# Patient Record
Sex: Female | Born: 1995 | Race: Black or African American | Hispanic: No | Marital: Single | Smoking: Never smoker
Health system: Southern US, Community
[De-identification: ages and names within clinical notes are randomized; demographics above are authoritative.]

## PROBLEM LIST (undated history)

## (undated) DIAGNOSIS — L309 Dermatitis, unspecified: Secondary | ICD-10-CM

## (undated) HISTORY — PX: NO PAST SURGERIES: SHX2092

---
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
## 2017-01-04 ENCOUNTER — Encounter (HOSPITAL_COMMUNITY): Payer: Self-pay | Admitting: Emergency Medicine

## 2017-01-04 ENCOUNTER — Ambulatory Visit (HOSPITAL_COMMUNITY)
Admission: EM | Admit: 2017-01-04 | Discharge: 2017-01-04 | Disposition: A | Discharge status: Home / Self Care | Payer: Medicaid Other | Attending: Internal Medicine | Admitting: Internal Medicine

## 2017-01-04 DIAGNOSIS — A6000 Herpesviral infection of urogenital system, unspecified: Secondary | ICD-10-CM | POA: Insufficient documentation

## 2017-01-04 DIAGNOSIS — A6009 Herpesviral infection of other urogenital tract: Secondary | ICD-10-CM

## 2017-01-04 DIAGNOSIS — T148XXA Other injury of unspecified body region, initial encounter: Secondary | ICD-10-CM

## 2017-01-04 DIAGNOSIS — Z3202 Encounter for pregnancy test, result negative: Secondary | ICD-10-CM

## 2017-01-04 DIAGNOSIS — R102 Pelvic and perineal pain: Secondary | ICD-10-CM | POA: Insufficient documentation

## 2017-01-04 DIAGNOSIS — Z202 Contact with and (suspected) exposure to infections with a predominantly sexual mode of transmission: Secondary | ICD-10-CM

## 2017-01-04 DIAGNOSIS — E669 Obesity, unspecified: Secondary | ICD-10-CM | POA: Insufficient documentation

## 2017-01-04 DIAGNOSIS — R030 Elevated blood-pressure reading, without diagnosis of hypertension: Secondary | ICD-10-CM

## 2017-01-04 DIAGNOSIS — N898 Other specified noninflammatory disorders of vagina: Secondary | ICD-10-CM

## 2017-01-04 LAB — POCT URINALYSIS DIP (DEVICE)
Bilirubin Urine: NEGATIVE
Glucose, UA: NEGATIVE mg/dL
Ketones, ur: NEGATIVE mg/dL
Nitrite: POSITIVE — AB
Protein, ur: 30 mg/dL — AB
Specific Gravity, Urine: 1.025 (ref 1.005–1.030)
Urobilinogen, UA: 1 mg/dL (ref 0.0–1.0)
pH: 6.5 (ref 5.0–8.0)

## 2017-01-04 LAB — POCT PREGNANCY, URINE: Preg Test, Ur: NEGATIVE

## 2017-01-04 MED ORDER — AZITHROMYCIN 250 MG PO TABS
ORAL_TABLET | ORAL | Status: AC
  Filled 2017-01-04: qty 8

## 2017-01-04 MED ORDER — METRONIDAZOLE 500 MG PO TABS
ORAL_TABLET | ORAL | Status: AC
  Filled 2017-01-04: qty 4

## 2017-01-04 MED ORDER — VALACYCLOVIR HCL 1 G PO TABS: 1000.0000 mg | ORAL_TABLET | Freq: Two times a day (BID) | ORAL | 0 refills | Status: DC

## 2017-01-04 MED ORDER — GEMIFLOXACIN MESYLATE 320 MG PO TABS: 320.0000 mg | ORAL_TABLET | Freq: Once | ORAL | 0 refills | Status: DC

## 2017-01-04 MED ORDER — ONDANSETRON 4 MG PO TBDP
8.0000 mg | ORAL_TABLET | Freq: Once | ORAL | Status: AC
  Administered 2017-01-04: 8 mg via ORAL

## 2017-01-04 MED ORDER — ONDANSETRON 4 MG PO TBDP
ORAL_TABLET | ORAL | Status: AC
  Filled 2017-01-04: qty 2

## 2017-01-04 MED ORDER — METRONIDAZOLE 500 MG PO TABS
2000.0000 mg | ORAL_TABLET | Freq: Once | ORAL | Status: AC
  Administered 2017-01-04: 2000 mg via ORAL

## 2017-01-04 MED ORDER — AZITHROMYCIN 250 MG PO TABS
2000.0000 mg | ORAL_TABLET | Freq: Once | ORAL | Status: AC
  Administered 2017-01-04: 2000 mg via ORAL

## 2017-01-04 MED ORDER — GEMIFLOXACIN MESYLATE 320 MG PO TABS: 320.0000 mg | ORAL_TABLET | Freq: Once | ORAL | 0 refills | Status: AC

## 2017-01-04 NOTE — ED Triage Notes (Signed)
Pt complains of suprapubic/pelvic pain for one week.  She denies any pain with urination, but does say she has had frequency, and some mild white, cloudy discharge.  She denies any fever.

## 2017-01-04 NOTE — Discharge Instructions (Signed)
You are being treated for STD exposure. Please take valtrex for most likely herpes infection and Gemifloxacin for gonorrhea treatment. You were given treatment for trichomoniasis and chlamydia in office. Avoid sexual activity of any kind until cx are resulted, symptoms resolved. Recommend follow up with GYN of your choice/or local health department. Condoms with all future sexual activity. Your syphillis and HIV test are pending, std cx pending. BP was elevated today please follow up with PCP for recheck in 2-3 days.

## 2017-01-04 NOTE — ED Provider Notes (Signed)
CSN: [id number]     Arrival date & time 01/04/17  1107 History   First MD Initiated Contact with Patient 01/04/17 1130     Chief Complaint  Patient presents with  . Pelvic Pain   (Consider location/radiation/quality/duration/timing/severity/associated sxs/prior Treatment) The history is provided by the patient and a friend. No language interpreter was used.  Exposure to STD  This is a new problem. The current episode started more than 1 week ago. The problem occurs constantly. The problem has not changed since onset.Associated symptoms comments: Vaginal pain with walking, recently shaved area, pt reports new partner and recent UTI. The symptoms are aggravated by walking. Nothing relieves the symptoms. She has tried nothing for the symptoms.    Past Medical History:  Diagnosis Date  . Eczema    History reviewed. No pertinent surgical history. History reviewed. No pertinent family history. Social History  Substance Use Topics  . Smoking status: Never Smoker  . Smokeless tobacco: Never Used  . Alcohol use Yes     Comment: social   OB History    No data available     Review of Systems  Genitourinary: Positive for dysuria, genital sores, vaginal discharge and vaginal pain.  Musculoskeletal: Negative for back pain.  Skin: Positive for wound.  All other systems reviewed and are negative.   Allergies  Ceclor [cefaclor]  Home Medications   Prior to Admission medications   Medication Sig Start Date End Date Taking? Authorizing Provider  gemifloxacin (FACTIVE) 320 MG tablet Take 1 tablet (320 mg total) by mouth once. 01/04/17 01/04/17  Clancy Gourd, NP  hydrocortisone cream 1 % Apply 1 application topically 2 (two) times daily as needed (Insect bite).    Historical Provider, MD  neomycin-bacitracin-polymyxin (NEOSPORIN) ointment Apply 1 application topically every 12 (twelve) hours. apply to eye    Historical Provider, MD  sulfamethoxazole-trimethoprim (SEPTRA DS) 800-160 MG  per tablet Take 1 tablet by mouth every 12 (twelve) hours. 03/18/13   Garlon Hatchet, PA-C  valACYclovir (VALTREX) 1000 MG tablet Take 1 tablet (1,000 mg total) by mouth 2 (two) times daily. 01/04/17   Clancy Gourd, NP   Meds Ordered and Administered this Visit   Medications  azithromycin (ZITHROMAX) tablet 2,000 mg (2,000 mg Oral Given 01/04/17 1244)  ondansetron (ZOFRAN-ODT) disintegrating tablet 8 mg (8 mg Oral Given 01/04/17 1244)  metroNIDAZOLE (FLAGYL) tablet 2,000 mg (2,000 mg Oral Given 01/04/17 1244)    BP 137/85 (BP Location: Left Wrist)   Pulse (!) 105   Temp 99.7 F (37.6 C) (Oral)   SpO2 96%  No data found.   Physical Exam  Constitutional: She is oriented to person, place, and time. She appears well-developed and well-nourished. She is active and cooperative. No distress.  Obese AA female, pt has 99.7 temp and elevated blood pressure/HR  HENT:  Head: Normocephalic.  Eyes: Pupils are equal, round, and reactive to light.  Neck: Normal range of motion.  Cardiovascular: Normal rate and regular rhythm.   Pulmonary/Chest: Effort normal and breath sounds normal.  Abdominal: Normal appearance and bowel sounds are normal. There is no tenderness.  Genitourinary: Uterus normal.    Pelvic exam was performed with patient supine. There is tenderness and lesion on the right labia. There is tenderness and lesion on the left labia. Cervix exhibits discharge. Cervix exhibits no motion tenderness and no friability. Right adnexum displays no mass, no tenderness and no fullness. Left adnexum displays no mass, no tenderness and no fullness. There is erythema in the  vagina. Vaginal discharge found.  Genitourinary Comments: Noted ulceration with erythematous base of vaginal vault, +moderate frothy green/yellow discharge; exam and cx performed with chaperone present.  Musculoskeletal: Normal range of motion.  Neurological: She is alert and oriented to person, place, and time. GCS eye subscore  is 4. GCS verbal subscore is 5. GCS motor subscore is 6.  Skin: Skin is warm and dry. Abrasion and lesion noted.     Psychiatric: She has a normal mood and affect. Her speech is normal and behavior is normal.  Nursing note and vitals reviewed.   Urgent Care Course     Procedures (including critical care time)  Labs Review Labs Reviewed  POCT URINALYSIS DIP (DEVICE) - Abnormal; Notable for the following:       Result Value   Hgb urine dipstick MODERATE (*)    Protein, ur 30 (*)    Nitrite POSITIVE (*)    Leukocytes, UA SMALL (*)    All other components within normal limits  HSV CULTURE AND TYPING  URINE CULTURE  RPR  HIV ANTIBODY (ROUTINE TESTING)  POCT PREGNANCY, URINE  CERVICOVAGINAL ANCILLARY ONLY    Imaging Review No results found.       MDM   1. Vaginal discharge   2. STD exposure   3. Herpes genitalis in women   4. Abrasion   5. Elevated blood pressure reading     1200: pelvic exam set up and performed  with cx obtained.   1230: Discussed findings with pt and most likely this is STD exposure(GC and HSV), will test for HIV and RPR in UC. Discussed refraining from sexual activity until cx and treatment completed, symptoms resolved. Condoms with all future sexual activity. Will treat with Azithromycin 2gm, Zofran  po, Flagyl 2 gm po, script for gemifloxacin 320 mg po given d/t cephalosporin allergy reported, Valtrex script for most likely HSV.  Recheck Bp as it was elevated today. Pt verbalized understanding to this provider. Aware Syphillis and HIV test were collected and pending. UC pending as well. Preg test negative.   Clancy Gourd, NP 01/04/17 1322

## 2017-01-05 LAB — CERVICOVAGINAL ANCILLARY ONLY
Bacterial vaginitis: POSITIVE — AB
Candida vaginitis: POSITIVE — AB
Chlamydia: NEGATIVE
Neisseria Gonorrhea: NEGATIVE
Trichomonas: NEGATIVE

## 2017-01-05 LAB — HIV ANTIBODY (ROUTINE TESTING W REFLEX): HIV Screen 4th Generation wRfx: NONREACTIVE

## 2017-01-05 LAB — RPR: RPR Ser Ql: NONREACTIVE

## 2017-01-06 ENCOUNTER — Telehealth: Payer: Self-pay | Admitting: Internal Medicine

## 2017-01-06 LAB — URINE CULTURE: Culture: >=100000 — AB

## 2017-01-06 LAB — HSV CULTURE AND TYPING

## 2017-01-06 MED ORDER — METRONIDAZOLE 500 MG PO TABS: 500.0000 mg | ORAL_TABLET | Freq: Two times a day (BID) | ORAL | 0 refills | Status: DC

## 2017-01-06 MED ORDER — FLUCONAZOLE 150 MG PO TABS: 150.0000 mg | ORAL_TABLET | Freq: Once | ORAL | 0 refills | Status: AC

## 2017-01-06 NOTE — Telephone Encounter (Signed)
Clinical staff, please see if patient was able to fill rx for gemifloxacin given at [facility] visit for UTI symptoms recently.   If filled and taken, no further UTI treatment is needed.  If not filled yet, and no alternate treatment for UTI offered/taken, would send rx for cephalexin  bid x 7d #14 no refills.  Also, tests for candida (yeast) and gardnerella (bacterial vaginosis) were positive. Rx fluconazole and metronidazole were sent to the pharmacy of record, [facility] at [facility] [facility] [employer].   Recheck or followup with PCP for further evaluation if symptoms are not improving.  LM

## 2019-08-20 ENCOUNTER — Telehealth (INDEPENDENT_AMBULATORY_CARE_PROVIDER_SITE_OTHER): Payer: Self-pay | Admitting: General Practice

## 2019-08-20 DIAGNOSIS — O3680X0 Pregnancy with inconclusive fetal viability, not applicable or unspecified: Secondary | ICD-10-CM

## 2019-08-20 NOTE — Telephone Encounter (Signed)
Patient called back stating she is returning our phone call. Discussed need for ultrasound appt with [place] and scheduled u/s 12/15 @ 1245 & informed patient. Also discussed going to [facility] for any severe pain or bleeding. Patient verbalized understanding & had no questions.

## 2019-08-20 NOTE — Telephone Encounter (Signed)
Baldo Ash from the [facility] called and left message on our nurse voicemail line stating she scanned the patient 11/23 & 12/4 and wasn't able to visualize anything. She states the patient reports a LMP of 9/15 so she would be about 11-12 weeks. Baldo Ash states the patient denies pain or bleeding and only has occasional mild cramps.   Called patient and her mother answered stating she wasn't with her daughter at the moment but would have her call us back shortly.

## 2019-08-28 ENCOUNTER — Ambulatory Visit (INDEPENDENT_AMBULATORY_CARE_PROVIDER_SITE_OTHER): Payer: Self-pay | Admitting: Student

## 2019-08-28 ENCOUNTER — Ambulatory Visit (HOSPITAL_COMMUNITY)
Admission: RE | Admit: 2019-08-28 | Discharge: 2019-08-28 | Disposition: A | Discharge status: Home / Self Care | Payer: Medicaid Other | Source: Ambulatory Visit | Attending: Medical | Admitting: Medical

## 2019-08-28 ENCOUNTER — Other Ambulatory Visit: Payer: Self-pay

## 2019-08-28 DIAGNOSIS — O200 Threatened abortion: Secondary | ICD-10-CM

## 2019-08-28 DIAGNOSIS — O3680X0 Pregnancy with inconclusive fetal viability, not applicable or unspecified: Secondary | ICD-10-CM | POA: Diagnosis not present

## 2019-08-28 LAB — POCT PREGNANCY, URINE: Preg Test, Ur: POSITIVE — AB

## 2019-08-28 NOTE — Patient Instructions (Signed)
Miscarriage °A miscarriage is the loss of an unborn baby (fetus) before the 20th week of pregnancy. °Follow these instructions at home: °Medicines ° °· Take over-the-counter and prescription medicines only as told by your doctor. °· If you were prescribed antibiotic medicine, take it as told by your doctor. Do not stop taking the antibiotic even if you start to feel better. °· Do not take NSAIDs unless your doctor says that this is safe for you. NSAIDs include aspirin and ibuprofen. These medicines can cause bleeding. °Activity °· Rest as directed. Ask your doctor what activities are safe for you. °· Have someone help you at home during this time. °General instructions °· Write down how many pads you use each day and how soaked they are. °· Watch the amount of tissue or clumps of blood (blood clots) that you pass from your vagina. Save any large amounts of tissue for your doctor. °· Do not use tampons, douche, or have sex until your doctor approves. °· To help you and your partner with the process of grieving, talk with your doctor or seek counseling. °· When you are ready, meet with your doctor to talk about steps you should take for your health. Also, talk with your doctor about steps to take to have a healthy pregnancy in the future. °· Keep all follow-up visits as told by your doctor. This is important. °Contact a doctor if: °· You have a fever or chills. °· You have vaginal discharge that smells bad. °· You have more bleeding. °Get help right away if: °· You have very bad cramps or pain in your back or belly. °· You pass clumps of blood that are walnut-sized or larger from your vagina. °· You pass tissue that is walnut-sized or larger from your vagina. °· You soak more than 1 regular pad in an hour. °· You get light-headed or weak. °· You faint (pass out). °· You have feelings of sadness that do not go away, or you have thoughts of hurting yourself. °Summary °· A miscarriage is the loss of an unborn baby before  the 20th week of pregnancy. °· Follow your doctor's instructions for home care. Keep all follow-up appointments. °· To help you and your partner with the process of grieving, talk with your doctor or seek counseling. °This information is not intended to replace advice given to you by your health care provider. Make sure you discuss any questions you have with your health care provider. °Document Released: 11/22/2011 Document Revised: 12/22/2018 Document Reviewed: 10/05/2016 °Elsevier Patient Education © 2020 [employer] ° °

## 2019-08-28 NOTE — Progress Notes (Signed)
Patient ID: Jessica Stuart, female   DOB: 1996-05-18, 23 y.o.   MRN: [id number]  History:  [name] is a 23 y.o. No obstetric history on file. who presents to clinic today for follow up for viability scan.  She denies any pain or bleeding. She has had two [place] at [place], and according to patient, "nothing was in the uterus".   The following portions of the patient's history were reviewed and updated as appropriate: allergies, current medications, family history, past medical history, social history, past surgical history and problem list.  Review of Systems:  Review of Systems  Constitutional: Negative.   HENT: Negative.   Respiratory: Negative.   Cardiovascular: Negative.   Skin: Negative.       Objective:  Physical Exam There were no vitals taken for this visit. Physical Exam  Constitutional: She appears well-developed.  Musculoskeletal:     Cervical back: Normal range of motion.  Psychiatric: She has a normal mood and affect.      Labs and Imaging Results for orders placed or performed in visit on 08/28/19 (from the past 24 hour(s))  Pregnancy, urine POC     Status: Abnormal   Collection Time: 08/28/19  3:22 PM  Result Value Ref Range   Preg Test, Ur POSITIVE (A) NEGATIVE    US OB LESS THAN 14 WEEKS WITH OB TRANSVAGINAL  Result Date: 08/28/2019 CLINICAL DATA:  First trimester pregnancy with inconclusive fetal viability. EXAM: OBSTETRIC <14 WK [place] AND TRANSVAGINAL OB US TECHNIQUE: Both transabdominal and transvaginal ultrasound examinations were performed for complete evaluation of the gestation as well as the maternal uterus, adnexal regions, and pelvic cul-de-sac. Transvaginal technique was performed to assess early pregnancy. COMPARISON:  None. FINDINGS: Intrauterine gestational sac: Single, with irregular sac shape Yolk sac:  Not Visualized. Embryo:  Not Visualized. Cardiac Activity: Not Visualized. MSD: 23 mm   7 w   2 d Subchorionic  hemorrhage:  None visualized. Maternal uterus/adnexae: Both ovaries are normal in appearance. Small left ovarian corpus luteum cyst noted. No adnexal mass identified. IMPRESSION: Findings are highly suspicious but not yet definitive for failed pregnancy. Recommend follow-up US in 10 days for definitive diagnosis. This recommendation follows SRU consensus guidelines: Diagnostic Criteria for Nonviable Pregnancy Early in the First Trimester. [employer] Med 2013; 347:4259-56. Electronically Signed   By: Marlaine Hind M.D.   On: 08/28/2019 14:39     Assessment & Plan:  1. Threatened miscarriage -Reviewed with patient that she most likely will have a miscarriage, given that she is supposed to be 7 weeks. [place] report is highly suspicious failed pregnancy but recommends follow-up. Will have follow-up for viability in 7-10 days.  -Bleeding and pain precautions discussed, including when to go to [facility]U.  -Patient is ambivalent about pregnancy; was not planned but she is starting to accept it.  -Explained that we could give her a medication today for miscarriage or she could wait; patient opts to have [place] in 7-10 days (or later bc of holidays).   - US OB Transvaginal; Future   Starr Lake, CNM 08/28/2019 4:50 PM

## 2019-08-28 NOTE — Progress Notes (Signed)
[place] results reviewed with Maye Hides, CNM and pt was informed of results. Pt reports that she has had symptoms of pregnancy since mid November. She denies having any bleeding or abdominal pain since that time. She was seen @ [facility] on 11/23 and again on 12/4. She was referred to our office following those appointments.

## 2019-09-10 ENCOUNTER — Ambulatory Visit (HOSPITAL_COMMUNITY): Admission: RE | Admit: 2019-09-10 | Payer: Medicaid Other | Source: Ambulatory Visit

## 2019-09-10 ENCOUNTER — Ambulatory Visit: Payer: Self-pay | Admitting: Nurse Practitioner

## 2019-09-12 ENCOUNTER — Ambulatory Visit: Payer: Self-pay | Admitting: Student

## 2019-09-17 ENCOUNTER — Inpatient Hospital Stay (HOSPITAL_COMMUNITY)
Admission: AD | Admit: 2019-09-17 | Discharge: 2019-09-17 | Disposition: A | Discharge status: Home / Self Care | Payer: Medicaid Other | Attending: Family Medicine | Admitting: Family Medicine

## 2019-09-17 ENCOUNTER — Encounter (HOSPITAL_COMMUNITY): Payer: Self-pay | Admitting: *Deleted

## 2019-09-17 ENCOUNTER — Inpatient Hospital Stay (HOSPITAL_COMMUNITY): Payer: Medicaid Other

## 2019-09-17 DIAGNOSIS — Z881 Allergy status to other antibiotic agents status: Secondary | ICD-10-CM | POA: Insufficient documentation

## 2019-09-17 DIAGNOSIS — R109 Unspecified abdominal pain: Secondary | ICD-10-CM

## 2019-09-17 DIAGNOSIS — R42 Dizziness and giddiness: Secondary | ICD-10-CM | POA: Insufficient documentation

## 2019-09-17 DIAGNOSIS — O4690 Antepartum hemorrhage, unspecified, unspecified trimester: Secondary | ICD-10-CM

## 2019-09-17 DIAGNOSIS — O039 Complete or unspecified spontaneous abortion without complication: Secondary | ICD-10-CM | POA: Diagnosis not present

## 2019-09-17 DIAGNOSIS — Z3A16 16 weeks gestation of pregnancy: Secondary | ICD-10-CM

## 2019-09-17 DIAGNOSIS — Z3A00 Weeks of gestation of pregnancy not specified: Secondary | ICD-10-CM | POA: Insufficient documentation

## 2019-09-17 DIAGNOSIS — O26891 Other specified pregnancy related conditions, first trimester: Secondary | ICD-10-CM | POA: Insufficient documentation

## 2019-09-17 DIAGNOSIS — O4691 Antepartum hemorrhage, unspecified, first trimester: Secondary | ICD-10-CM | POA: Diagnosis not present

## 2019-09-17 DIAGNOSIS — N939 Abnormal uterine and vaginal bleeding, unspecified: Secondary | ICD-10-CM | POA: Diagnosis present

## 2019-09-17 LAB — CBC
HCT: 27 % — ABNORMAL LOW (ref 36.0–46.0)
Hemoglobin: 8.2 g/dL — ABNORMAL LOW (ref 12.0–15.0)
MCH: 25.1 pg — ABNORMAL LOW (ref 26.0–34.0)
MCHC: 30.4 g/dL (ref 30.0–36.0)
MCV: 82.6 fL (ref 80.0–100.0)
Platelets: 350 10*3/uL (ref 150–400)
RBC: 3.27 MIL/uL — ABNORMAL LOW (ref 3.87–5.11)
RDW: 15.5 % (ref 11.5–15.5)
WBC: 16.8 10*3/uL — ABNORMAL HIGH (ref 4.0–10.5)
nRBC: 0 % (ref 0.0–0.2)

## 2019-09-17 LAB — HCG, QUANTITATIVE, PREGNANCY: hCG, Beta Chain, Quant, S: 405 m[IU]/mL — ABNORMAL HIGH (ref <5)

## 2019-09-17 LAB — ABO/RH: ABO/RH(D): A POS

## 2019-09-17 MED ORDER — HYDROMORPHONE HCL 1 MG/ML IJ SOLN
1.0000 mg | Freq: Once | INTRAMUSCULAR | Status: AC
  Administered 2019-09-17: 1 mg via INTRAMUSCULAR
  Filled 2019-09-17: qty 1

## 2019-09-17 MED ORDER — ACETAMINOPHEN-CODEINE #3 300-30 MG PO TABS: 1.0000 | ORAL_TABLET | Freq: Four times a day (QID) | ORAL | 0 refills | Status: DC | PRN

## 2019-09-17 MED ORDER — MISOPROSTOL 200 MCG PO TABS
800.0000 ug | ORAL_TABLET | Freq: Once | ORAL | Status: AC
  Administered 2019-09-17: 800 ug via BUCCAL
  Filled 2019-09-17: qty 4

## 2019-09-17 MED ORDER — ACETAMINOPHEN-CODEINE #3 300-30 MG PO TABS
2.0000 | ORAL_TABLET | Freq: Once | ORAL | Status: AC
  Administered 2019-09-17: 2 via ORAL
  Filled 2019-09-17: qty 2

## 2019-09-17 MED ORDER — PROMETHAZINE HCL 12.5 MG PO TABS: 12.5000 mg | ORAL_TABLET | Freq: Four times a day (QID) | ORAL | 0 refills | Status: DC | PRN

## 2019-09-17 MED ORDER — FERROUS SULFATE 325 (65 FE) MG PO TABS
325.0000 mg | ORAL_TABLET | Freq: Once | ORAL | Status: AC
  Administered 2019-09-17: 325 mg via ORAL
  Filled 2019-09-17: qty 1

## 2019-09-17 NOTE — MAU Provider Note (Signed)
History     CSN: [id number]  Arrival date and time: 09/17/19 1243   First Provider Initiated Contact with Patient 09/17/19 1321      Chief Complaint  Patient presents with  . Vaginal Bleeding   Jessica Stuart is a 24 y.o. early pregnant female who presents to MAU with complaints of vaginal bleeding and abdominal pain. She reports being seen on 12/15 and given miscarriage precautions after US showed irregular shaped gestational sac. She reports needing to follow up for repeat labs but did not. Since that appointment has been bleeding and having abdominal cramping but this morning pain increased significantly. She describes abdominal pain as lower cramping - rates pain 10/10 and has taken tylenol without relief. She reports passing multiple clots at home and in the wheelchair of MAU. She reports feeling lightheaded and dizzy as well.    OB History    Gravida  1   Para      Term      Preterm      AB      Living        SAB      TAB      Ectopic      Multiple      Live Births              Past Medical History:  Diagnosis Date  . Eczema     History reviewed. No pertinent surgical history.  History reviewed. No pertinent family history.  Social History   Tobacco Use  . Smoking status: Never Smoker  . Smokeless tobacco: Never Used  Substance Use Topics  . Alcohol use: Yes    Comment: social  . Drug use: No    Allergies:  Allergies  Allergen Reactions  . Ceclor [Cefaclor] Nausea And Vomiting and Swelling    Medications Prior to Admission  Medication Sig Dispense Refill Last Dose  . hydrocortisone cream 1 % Apply 1 application topically 2 (two) times daily as needed (Insect bite).     . metroNIDAZOLE (FLAGYL) 500 MG tablet Take 1 tablet (500 mg total) by mouth 2 (two) times daily. 14 tablet 0   . neomycin-bacitracin-polymyxin (NEOSPORIN) ointment Apply 1 application topically every 12 (twelve) hours. apply to eye     . sulfamethoxazole-trimethoprim  (SEPTRA DS) 800-160 MG per tablet Take 1 tablet by mouth every 12 (twelve) hours. 20 tablet 0   . valACYclovir (VALTREX) 1000 MG tablet Take 1 tablet (1,000 mg total) by mouth 2 (two) times daily. 20 tablet 0     Review of Systems  Constitutional: Negative.   Respiratory: Negative.   Cardiovascular: Negative.   Gastrointestinal: Positive for abdominal pain. Negative for constipation, diarrhea, nausea and vomiting.  Genitourinary: Positive for vaginal bleeding. Negative for difficulty urinating, dysuria, frequency, pelvic pain and urgency.  Neurological: Positive for dizziness, syncope and light-headedness. Negative for headaches.       Near syncope episode in triage of MAU   Physical Exam   Blood pressure 129/64, pulse 85, temperature 98.3 F (36.8 C), resp. rate 16, height 5\' 10"  (1.778 m), weight (!) 143.3 kg, last menstrual period 05/29/2019, SpO2 100 %.  Physical Exam  Nursing note and vitals reviewed. Constitutional: She is oriented to person, place, and time. She appears well-developed and well-nourished. She appears distressed.  Patient is tearful and grimacing in pain upon arrival to room in MAU  Cardiovascular: Normal rate and regular rhythm.  Respiratory: Effort normal and breath sounds normal.  GI: Soft. There is  no abdominal tenderness. There is no rebound and no guarding.  Genitourinary:    Vaginal bleeding present.  There is bleeding in the vagina.    Genitourinary Comments: Pelvic exam: Cervix pink, visually open to 2cm with POC in cervical os, without lesion, moderate amount of dark red vaginal bleeding present - one large clot removed from vaginal cavity- 5 faux swabs used, vaginal walls and external genitalia normal   Musculoskeletal:        General: No edema. Normal range of motion.  Neurological: She is alert and oriented to person, place, and time.  Psychiatric: She has a normal mood and affect. Her behavior is normal. Thought content normal.   MAU Course   Procedures  MDM  1mg  IM dilaudid ordered and given to patient prior to pelvic examination d/t patient grimacing and crying in pain.   CBC,  ABO/Rh and HCG ordered in addition to to assess for pregnancy   Orders Placed This Encounter  Procedures  . US OB Transvaginal  . CBC  . hCG, quantitative, pregnancy  . ABO/Rh   Meds ordered this encounter  Medications  . HYDROmorphone (DILAUDID) injection 1 mg  . HYDROmorphone (DILAUDID) injection 1 mg  . misoprostol (CYTOTEC) tablet 800 mcg  . acetaminophen-codeine (TYLENOL #3) 300-30 MG per tablet 2 tablet  . ferrous sulfate tablet 325 mg  . acetaminophen-codeine (TYLENOL #3) 300-30 MG tablet    Sig: Take 1-2 tablets by mouth every 6 (six) hours as needed for moderate pain.    Dispense:  15 tablet    Refill:  0    Order Specific Question:   Supervising Provider    Answer:   ERVIN, MICHAEL L [[id number]]  . promethazine (PHENERGAN) 12.5 MG tablet    Sig: Take 1 tablet (12.5 mg total) by mouth every 6 (six) hours as needed for nausea or vomiting.    Dispense:  20 tablet    Refill:  0    Order Specific Question:   Supervising Provider    Answer:   [place] [[id number]]   Labs and Hermina Staggers report reviewed:  Results for orders placed or performed during the hospital encounter of 09/17/19 (from the past 48 hour(s))  CBC     Status: Abnormal   Collection Time: 09/17/19  1:26 PM  Result Value Ref Range   WBC 16.8 (H) 4.0 - 10.5 K/uL   RBC 3.27 (L) 3.87 - 5.11 MIL/uL   Hemoglobin 8.2 (L) 12.0 - 15.0 g/dL   HCT 11/15/19 (L) 89.1 - 69.4 %   MCV 82.6 80.0 - 100.0 fL   MCH 25.1 (L) 26.0 - 34.0 pg   MCHC 30.4 30.0 - 36.0 g/dL   RDW 50.3 88.8 - 28.0 %   Platelets 350 150 - 400 K/uL   nRBC 0.0 0.0 - 0.2 %    Comment: Performed at [facility], [address], [place], [place]  hCG, quantitative, pregnancy     Status: Abnormal   Collection Time: 09/17/19  1:26 PM  Result Value Ref Range   hCG, Beta Chain, Quant, S 405 (H) <5 mIU/mL     Comment:          GEST. AGE      CONC.  (mIU/mL)   <=1 WEEK        5 - 50     2 WEEKS       50 - 500     3 WEEKS  100 - 10,000     4 WEEKS     1,000 - 30,000     5 WEEKS     3,500 - 115,000   6-8 WEEKS     12,000 - 270,000    12 WEEKS     15,000 - 220,000        FEMALE AND NON-PREGNANT FEMALE:     LESS THAN 5 mIU/mL Performed at [facility], [address] [place], [place] [address]   ABO/Rh     Status: None   Collection Time: 09/17/19  1:27 PM  Result Value Ref Range   ABO/RH(D) A POS    No rh immune globuloin      NOT A RH IMMUNE GLOBULIN CANDIDATE, PT RH POSITIVE Performed at [facility], [address], [place], [place] [address]    [place] Transvaginal  Result Date: 09/17/2019 CLINICAL DATA:  Vaginal bleeding during pregnancy. First trimester pregnancy with inconclusive fetal viability. EXAM: TRANSVAGINAL OB ULTRASOUND TECHNIQUE: Transvaginal ultrasound was performed for complete evaluation of the gestation as well as the maternal uterus, adnexal regions, and pelvic cul-de-sac. COMPARISON:  08/28/2019 FINDINGS: Intrauterine gestational sac: No normal sac seen; previously seen irregular gestational sac no longer visualized Maternal uterus/adnexae: Complex heterogeneous material is seen within the endometrial cavity of the uterus with some areas of internal blood flow on color Doppler ultrasound. This is located in the endocervical canal and lower uterine segment and measures approximately 7.9 x 3.7 cm. No definite uterine fibroids identified. Ovaries are not visualized, however no adnexal mass identified. No evidence of free fluid. IMPRESSION: Complex collection in the lower uterine segment and endocervical canal, with some vascularity on color Doppler ultrasound, compatible with retained products conception in the correct clinical setting. Electronically Signed   By: Danae Orleans M.D.   On: 09/17/2019 15:14   - Retained products in the lower uterine segment noted on  [place] - of Cytotec given to patient in MAU  - Patient reports pain is still 8/10 upon return from [place] - second dose of dilaudid given IM   Reassessment @1615 :  - Bleeding is well controlled and is not passing clots  - Labs noted IDA with hgb of 8.2 - educated and discussed d/t lightheaded and dizziness initially will need to treat, given options of feraheme in MAU or Iron supplementation initiating here then continuing at home - patient reports she would like iron supplementation  - Patient reports pain is now 5/10  - Additional pain medication ordered (Tylenol #3) which would also be prescribed for home use and Ferrous sulfate tablet ordered.   Reassessment @ 1730:  - Patient reports pain is down to 3-4/10 and was able to sleep   Discussed reasons to return to MAU. Follow up in the office for miscarriage. Scheduled follow up HCG in 1 week and appointment with provider in 2 weeks. Return to MAU as needed. Pt stable at time of discharge. Rx for Tylenol #3, phenergan and ferrous sulfate sent to pharmacy of choice.  Assessment and Plan   1. SAB (spontaneous abortion)   2. Vaginal bleeding during pregnancy   3. Abdominal pain during pregnancy in first trimester    Discharge home Follow up as scheduled in the office Return to MAU as needed for reasons discussed and/or emergencies  Take medications as prescribed   Follow-up Information    [facility]. Go on 09/24/2019.   Specialty: Obstetrics and Gynecology Why: Follow up in  1 week for labs and 2 week with provider Contact information: [address], [place] [id number] [place] [id number] [phone]         Allergies as of 09/17/2019      Reactions   Ceclor [cefaclor] Nausea And Vomiting, Swelling      Medication List    STOP taking these medications   hydrocortisone cream 1 %   metroNIDAZOLE 500 MG tablet Commonly known as: FLAGYL    sulfamethoxazole-trimethoprim 800-160 MG tablet Commonly known as: Septra DS     TAKE these medications   acetaminophen-codeine 300-30 MG tablet Commonly known as: TYLENOL #3 Take 1-2 tablets by mouth every 6 (six) hours as needed for moderate pain.   neomycin-bacitracin-polymyxin ointment Commonly known as: NEOSPORIN Apply 1 application topically every 12 (twelve) hours. apply to eye   promethazine 12.5 MG tablet Commonly known as: PHENERGAN Take 1 tablet (12.5 mg total) by mouth every 6 (six) hours as needed for nausea or vomiting.   valACYclovir 1000 MG tablet Commonly known as: VALTREX Take 1 tablet (1,000 mg total) by mouth 2 (two) times daily.       Lajean Manes CNM 09/17/2019, 7:59 PM

## 2019-09-17 NOTE — MAU Note (Addendum)
Pt began bleeding heavily this morning. Also passed several clots.  She is also having cramping and feeling  light-headed and unsteady.  S/P workup for miscarriage in progress.

## 2019-09-18 ENCOUNTER — Other Ambulatory Visit: Payer: Self-pay | Admitting: Certified Nurse Midwife

## 2019-09-18 DIAGNOSIS — D508 Other iron deficiency anemias: Secondary | ICD-10-CM

## 2019-09-18 MED ORDER — FERROUS SULFATE 325 (65 FE) MG PO TABS: 325.0000 mg | ORAL_TABLET | Freq: Two times a day (BID) | ORAL | 0 refills | Status: DC

## 2019-09-24 ENCOUNTER — Other Ambulatory Visit: Payer: Self-pay

## 2019-09-24 ENCOUNTER — Ambulatory Visit: Payer: Medicaid Other

## 2019-09-24 DIAGNOSIS — O039 Complete or unspecified spontaneous abortion without complication: Secondary | ICD-10-CM

## 2019-09-24 DIAGNOSIS — D508 Other iron deficiency anemias: Secondary | ICD-10-CM

## 2019-09-24 NOTE — Progress Notes (Signed)
Chart reviewed with Judeth Horn, NP who agreed that pt needs a non stat beta and not a STAT beta due to confirmed miscarriage.  Pt aware.    Addison Naegeli, RN 09/24/19

## 2019-09-25 LAB — BETA HCG QUANT (REF LAB): hCG Quant: 55 m[IU]/mL

## 2019-10-04 ENCOUNTER — Encounter: Payer: Medicaid Other | Admitting: Obstetrics and Gynecology

## 2020-09-12 ENCOUNTER — Encounter (HOSPITAL_COMMUNITY): Payer: Self-pay | Admitting: Obstetrics & Gynecology

## 2020-09-12 ENCOUNTER — Other Ambulatory Visit: Payer: Self-pay

## 2020-09-12 ENCOUNTER — Inpatient Hospital Stay (HOSPITAL_COMMUNITY)
Admission: AD | Admit: 2020-09-12 | Discharge: 2020-09-12 | Disposition: A | Discharge status: Home / Self Care | Payer: Medicaid Other | Attending: Obstetrics & Gynecology | Admitting: Obstetrics & Gynecology

## 2020-09-12 DIAGNOSIS — B373 Candidiasis of vulva and vagina: Secondary | ICD-10-CM | POA: Diagnosis not present

## 2020-09-12 DIAGNOSIS — Z3491 Encounter for supervision of normal pregnancy, unspecified, first trimester: Secondary | ICD-10-CM

## 2020-09-12 DIAGNOSIS — O4691 Antepartum hemorrhage, unspecified, first trimester: Secondary | ICD-10-CM

## 2020-09-12 DIAGNOSIS — O209 Hemorrhage in early pregnancy, unspecified: Secondary | ICD-10-CM | POA: Insufficient documentation

## 2020-09-12 DIAGNOSIS — Z3A13 13 weeks gestation of pregnancy: Secondary | ICD-10-CM | POA: Diagnosis not present

## 2020-09-12 DIAGNOSIS — O98811 Other maternal infectious and parasitic diseases complicating pregnancy, first trimester: Secondary | ICD-10-CM

## 2020-09-12 LAB — URINALYSIS, ROUTINE W REFLEX MICROSCOPIC
Bacteria, UA: NONE SEEN
Bilirubin Urine: NEGATIVE
Glucose, UA: NEGATIVE mg/dL
Ketones, ur: NEGATIVE mg/dL
Nitrite: NEGATIVE
Protein, ur: NEGATIVE mg/dL
Specific Gravity, Urine: 1.017 (ref 1.005–1.030)
pH: 8 (ref 5.0–8.0)

## 2020-09-12 LAB — WET PREP, GENITAL
Sperm: NONE SEEN
Trich, Wet Prep: NONE SEEN

## 2020-09-12 LAB — CBC
HCT: 32.6 % — ABNORMAL LOW (ref 36.0–46.0)
Hemoglobin: 10.7 g/dL — ABNORMAL LOW (ref 12.0–15.0)
MCH: 26.4 pg (ref 26.0–34.0)
MCHC: 32.8 g/dL (ref 30.0–36.0)
MCV: 80.5 fL (ref 80.0–100.0)
Platelets: 256 10*3/uL (ref 150–400)
RBC: 4.05 MIL/uL (ref 3.87–5.11)
RDW: 14.8 % (ref 11.5–15.5)
WBC: 10.3 10*3/uL (ref 4.0–10.5)
nRBC: 0 % (ref 0.0–0.2)

## 2020-09-12 LAB — POCT PREGNANCY, URINE: Preg Test, Ur: POSITIVE — AB

## 2020-09-12 MED ORDER — TERCONAZOLE 0.8 % VA CREA: 1.0000 | TOPICAL_CREAM | Freq: Every day | VAGINAL | 0 refills | Status: DC

## 2020-09-12 NOTE — MAU Provider Note (Addendum)
History     CSN: [id number]  Arrival date and time: 09/12/20 1607   Event Date/Time   First Provider Initiated Contact with Patient 09/12/20 [phone]      Chief Complaint  Patient presents with  . Vaginal Bleeding   HPI Jessica Stuart is a 24 y.o. G2P0 at [redacted]w[redacted]d who presents to MAU with chief complaint of vaginal bleeding. This is a new problem, onset last night 09/11/2020 around 2000 hours. She denies pain, dysuria, fever. Patient endorses uncomplicated viability scan at a local clinic. She is remote from intercourse.  OB history includes miscarriage in 09/2019.   OB History    Gravida  2   Para      Term      Preterm      AB      Living        SAB      IAB      Ectopic      Multiple      Live Births              Past Medical History:  Diagnosis Date  . Eczema     No past surgical history on file.  No family history on file.  Social History   Tobacco Use  . Smoking status: Never Smoker  . Smokeless tobacco: Never Used  Substance Use Topics  . Alcohol use: Yes    Comment: social  . Drug use: No    Allergies:  Allergies  Allergen Reactions  . Ceclor [Cefaclor] Nausea And Vomiting and Swelling    Medications Prior to Admission  Medication Sig Dispense Refill Last Dose  . acetaminophen-codeine (TYLENOL #3) 300-30 MG tablet Take 1-2 tablets by mouth every 6 (six) hours as needed for moderate pain. 15 tablet 0   . ferrous sulfate (FERROUSUL) 325 (65 FE) MG tablet Take 1 tablet (325 mg total) by mouth 2 (two) times daily. 60 tablet 0   . neomycin-bacitracin-polymyxin (NEOSPORIN) ointment Apply 1 application topically every 12 (twelve) hours. apply to eye     . promethazine (PHENERGAN) 12.5 MG tablet Take 1 tablet (12.5 mg total) by mouth every 6 (six) hours as needed for nausea or vomiting. 20 tablet 0   . valACYclovir (VALTREX) 1000 MG tablet Take 1 tablet (1,000 mg total) by mouth 2 (two) times daily. 20 tablet 0     Review of Systems   Genitourinary: Positive for vaginal bleeding.  All other systems reviewed and are negative.  Physical Exam   Blood pressure 124/69, pulse 82, temperature 98.5 F (36.9 C), temperature source Oral, resp. rate 16, height 5\' 9"  (1.753 m), weight 135.2 kg, SpO2 99 %, unknown if currently breastfeeding.  Physical Exam Vitals and nursing note reviewed. Exam conducted with a chaperone present.  Constitutional:      Appearance: Normal appearance.  Cardiovascular:     Rate and Rhythm: Normal rate.     Pulses: Normal pulses.  Pulmonary:     Effort: Pulmonary effort is normal.     Breath sounds: Normal breath sounds.  Abdominal:     General: Bowel sounds are normal.     Tenderness: There is no right CVA tenderness or left CVA tenderness.  Skin:    General: Skin is warm and dry.     Capillary Refill: Capillary refill takes less than 2 seconds.  Neurological:     Mental Status: She is alert and oriented to person, place, and time.  Psychiatric:  Mood and Affect: Mood normal.        Behavior: Behavior normal.        Thought Content: Thought content normal.        Judgment: Judgment normal.     MAU Course  Procedures  --S/p confirmation of IUP at outside clinic. Documentation provided by patient  --Bedside Ultrasound for FHR check: 150 bpm, very active fetal movement Patient informed that the ultrasound is considered a limited obstetric ultrasound and is not intended to be a complete ultrasound exam.  Patient also informed that the ultrasound is not being completed with the intent of assessing for fetal or placental anomalies or any pelvic abnormalities.  Explained that the purpose of today's ultrasound is to assess for fetal heart rate.  Patient acknowledges the purpose of the exam and the limitations of the study.    Patient Vitals for the past 24 hrs:  BP Temp Temp src Pulse Resp SpO2 Height Weight  09/12/20 0741 -- -- -- -- 19 -- -- --  09/12/20 0729 (!) 131/51 -- -- 80 --  -- -- --  09/12/20 0649 124/69 98.5 F (36.9 C) Oral 82 16 99 % 5\' 9"  (1.753 m) 135.2 kg   Results for orders placed or performed during the hospital encounter of 09/12/20 (from the past 24 hour(s))  Pregnancy, urine POC     Status: Abnormal   Collection Time: 09/12/20  6:45 AM  Result Value Ref Range   Preg Test, Ur POSITIVE (A) NEGATIVE  CBC     Status: Abnormal   Collection Time: 09/12/20  7:16 AM  Result Value Ref Range   WBC 10.3 4.0 - 10.5 K/uL   RBC 4.05 3.87 - 5.11 MIL/uL   Hemoglobin 10.7 (L) 12.0 - 15.0 g/dL   HCT 09/14/20 (L) 96.7 - 59.1 %   MCV 80.5 80.0 - 100.0 fL   MCH 26.4 26.0 - 34.0 pg   MCHC 32.8 30.0 - 36.0 g/dL   RDW 63.8 46.6 - 59.9 %   Platelets 256 150 - 400 K/uL   nRBC 0.0 0.0 - 0.2 %  Wet prep, genital     Status: Abnormal   Collection Time: 09/12/20  7:21 AM  Result Value Ref Range   Yeast Wet Prep HPF POC PRESENT (A) NONE SEEN   Trich, Wet Prep NONE SEEN NONE SEEN   Clue Cells Wet Prep HPF POC PRESENT (A) NONE SEEN   WBC, Wet Prep HPF POC MANY (A) NONE SEEN   Sperm NONE SEEN    Meds ordered this encounter  Medications  . terconazole (TERAZOL 3) 0.8 % vaginal cream    Sig: Place 1 applicator vaginally at bedtime. Apply nightly for three nights.    Dispense:  20 g    Refill:  0    Order Specific Question:   Supervising Provider    Answer:   09/14/20 Malachy Chamber    Assessment and Plan  --24 y.o. G2P0 at [redacted]w[redacted]d  --FHT and active fetal movement noted on BSUS --Vulvovaginal Candidiasis, treatment prescribed --Vaginal bleeding, blood type A POS --Discharge home in stable condition  [redacted]w[redacted]d, CNM 09/12/2020, 8:05 AM

## 2020-09-12 NOTE — MAU Note (Signed)
Jessica Stuart is a 24 y.o. at [redacted]w[redacted]d here in MAU reporting: last night felt a gush of blood. Now is seeing some old blood when she wipes. No pain, no other abnormal discharge.  [facility] and had + UPT and an ultrasound. States + FHT on u/s.  Onset of complaint: last night  Pain score: 0/10   Vitals:   09/12/20 0649  BP: 124/69  Pulse: 82  Resp: 16  Temp: 98.5 F (36.9 C)  SpO2: 99%     Lab orders placed from triage: UA, UPT

## 2020-09-12 NOTE — Discharge Instructions (Signed)

## 2020-09-13 NOTE — L&D Delivery Note (Cosign Needed)
Vacuum Assisted Vaginal Delivery  Indication for operative vaginal delivery: non-reassuring fetal heart tones  Patient was examined and found to be fully dilated with fetal station of +2.  Patient's bladder was noted to be empty, and there were no known fetal contraindications to operative vaginal delivery.  FHR tracing remarkable for prolonged deceleration to the 90s x 7 min.  Risks of vacuum assistance were discussed in detail, including but not limited to, bleeding, infection, damage to maternal tissues, fetal cephalohematoma, inability to effect vaginal delivery of the head or shoulder dystocia that cannot be resolved by established maneuvers and need for emergency cesarean section.  Patient gave verbal consent.  The soft vacuum cup was positioned over the sagittal suture 3 cm anterior to posterior fontanelle.  Pressure was then increased to 500 mmHg, and the patient was instructed to push.  Pulling was administered along the pelvic curve while patient was pushing; there were 2 contractions and 0 popoffs.  Vacuum was reduced in between contractions.  The infant was then delivered atraumatically, noted to be a viable female infant, Apgars of 7 and 9. Bilateral compound hands noted with delivery. Arterial blood gas sent for collection. Neonatology present for delivery.There was spontaneous placental delivery, intact with three-vessel cord.  Placenta was noted to have marginal cord insertion and was sent to pathology. 20 mL local lidocaine used for repair. 2nd degree perineal laceration noted requiring repair with 3-0 Vicryl in the usual fashion. Periureteral laceration noted, hemostatic. [id number].   Sponge, instrument and needle counts were correct x 2.  The patient and baby were stable after delivery and remained in couplet care, with plans to transfer later to postpartum unit   Mom to postpartum.  Baby to Couplet care / Skin to Skin.  Alric Seton 02/28/2021, 6:47 PM

## 2020-09-15 LAB — GC/CHLAMYDIA PROBE AMP (~~LOC~~) NOT AT ARMC
Chlamydia: NEGATIVE
Comment: NEGATIVE
Comment: NORMAL
Neisseria Gonorrhea: NEGATIVE

## 2020-09-22 ENCOUNTER — Encounter (HOSPITAL_COMMUNITY): Payer: Self-pay | Admitting: Obstetrics & Gynecology

## 2020-09-25 ENCOUNTER — Ambulatory Visit (INDEPENDENT_AMBULATORY_CARE_PROVIDER_SITE_OTHER): Payer: Medicaid Other | Admitting: Obstetrics and Gynecology

## 2020-09-25 ENCOUNTER — Other Ambulatory Visit: Payer: Self-pay

## 2020-09-25 ENCOUNTER — Other Ambulatory Visit (HOSPITAL_COMMUNITY)
Admission: RE | Admit: 2020-09-25 | Discharge: 2020-09-25 | Disposition: A | Discharge status: Home / Self Care | Payer: Medicaid Other | Source: Ambulatory Visit | Attending: Obstetrics and Gynecology | Admitting: Obstetrics and Gynecology

## 2020-09-25 ENCOUNTER — Encounter: Payer: Self-pay | Admitting: Obstetrics and Gynecology

## 2020-09-25 VITALS — BP 119/77 | HR 94 | Temp 98.4°F | Wt 289.0 lb

## 2020-09-25 DIAGNOSIS — O99210 Obesity complicating pregnancy, unspecified trimester: Secondary | ICD-10-CM | POA: Insufficient documentation

## 2020-09-25 DIAGNOSIS — Z3480 Encounter for supervision of other normal pregnancy, unspecified trimester: Secondary | ICD-10-CM | POA: Diagnosis not present

## 2020-09-25 DIAGNOSIS — B009 Herpesviral infection, unspecified: Secondary | ICD-10-CM

## 2020-09-25 DIAGNOSIS — Z3A15 15 weeks gestation of pregnancy: Secondary | ICD-10-CM

## 2020-09-25 DIAGNOSIS — O98519 Other viral diseases complicating pregnancy, unspecified trimester: Secondary | ICD-10-CM

## 2020-09-25 MED ORDER — ASPIRIN 81 MG PO CHEW: 81.0000 mg | CHEWABLE_TABLET | Freq: Every day | ORAL | 6 refills | Status: DC

## 2020-09-25 MED ORDER — GOJJI WEIGHT SCALE MISC: 1.0000 | Freq: Every day | 0 refills | Status: DC | PRN

## 2020-09-25 MED ORDER — BLOOD PRESSURE MONITOR AUTOMAT DEVI: 1.0000 | Freq: Every day | 0 refills | Status: DC

## 2020-09-25 NOTE — Progress Notes (Signed)
INITIAL OBSTETRICAL VISIT Patient name: Jessica Stuart MRN [id number]  Date of birth: 1996-01-13 Chief Complaint:   Initial Prenatal Visit  History of Present Illness:   Jessica Stuart is a 25 y.o. G28P0010 African American female at [redacted]w[redacted]d by LMP with an Estimated Date of Delivery: 03/15/21 being seen today for her initial obstetrical visit.  Her obstetrical history is significant for obesity and h/o HSV. This is an unplanned pregnancy. She and the father of the baby (FOB) "Eliuid" do not live together and are minimally involved. She has a support system that consists of her mother/father/siblings. Today she reports no complaints.   Patient's last menstrual period was 06/08/2020. Last pap unknown. Results were: unknown Review of Systems:   Pertinent items are noted in HPI Denies cramping/contractions, leakage of fluid, vaginal bleeding, abnormal vaginal discharge w/ itching/odor/irritation, headaches, visual changes, shortness of breath, chest pain, abdominal pain, severe nausea/vomiting, or problems with urination or bowel movements unless otherwise stated above.  Pertinent History Reviewed:  Reviewed past medical,surgical, social, obstetrical and family history.  Reviewed problem list, medications and allergies. OB History  Gravida Para Term Preterm AB Living  2 0 0 0 1    SAB IAB Ectopic Multiple Live Births  1 0 0        # Outcome Date GA Lbr Len/2nd Weight Sex Delivery Anes PTL Lv  2 Current           1 SAB 2021           Physical Assessment:   Vitals:   09/25/20 1007  BP: 119/77  Pulse: 94  Temp: 98.4 F (36.9 C)  Weight: 289 lb (131.1 kg)  Body mass index is 42.68 kg/m.       Physical Examination:  General appearance - well appearing, and in no distress  Mental status - alert, oriented to person, place, and time  Psych:  She has a normal mood and affect  Skin - warm and dry, normal color, no suspicious lesions noted  Chest - effort normal, all lung fields clear  to auscultation bilaterally  Heart - normal rate and regular rhythm  Abdomen - soft, nontender  Extremities:  No swelling or varicosities noted  Pelvic - VULVA: normal appearing vulva with no masses, tenderness or lesions  VAGINA: normal appearing vagina with normal color and discharge, no lesions.   CERVIX: normal appearing cervix without discharge or lesions, no CMT  Thin prep pap is done with reflex HR HPV cotesting   FHTs by doppler: 144 bpm  Assessment & Plan:  1) Low-Risk Pregnancy G2P0010 at [redacted]w[redacted]d with an Estimated Date of Delivery: 03/15/21   2) Initial OB visit - Welcomed to practice and introduced self to patient in addition to discussing other advanced practice providers that she may be seeing at this practice - Congratulated patient - Anticipatory guidance on upcoming appointments - Educated on COVID19 and pregnancy and the integration of virtual appointments  - Educated on babyscripts app- patient reports she has not received email, encouraged to look in spam folder and to call office if she still has not received email - patient verbalizes understanding    3) Supervision of other normal pregnancy, antepartum - Cervicovaginal ancillary only( [place]) - Cytology - PAP( [place]) - Hemoglobin A1c - Culture, OB Urine - AFP, Serum, Open Spina Bifida - Genetic Screening - Enroll Patient in Babyscripts - Misc. Devices (GOJJI WEIGHT SCALE) MISC; 1 Device by Does not apply route daily as needed. To weight self  daily as needed at home. ICD-10 code: Z34.90  Dispense: 1 each; Refill: 0 - Blood Pressure Monitoring (BLOOD PRESSURE MONITOR AUTOMAT) DEVI; 1 Device by Does not apply route daily. Automatic blood pressure cuff regular size. To monitor blood pressure regularly at home. ICD-10 code:Z34.90  Dispense: 1 each; Refill: 0 - CBC/D/Plt+RPR+Rh+ABO+Rub Ab... - MFM Detail 14+ wks U/S   2. Obesity in pregnancy, antepartum - Cervicovaginal ancillary only( [place]) - Cytology  - PAP( [place]) - Hemoglobin A1c - Culture, OB Urine - AFP, Serum, Open Spina Bifida - Genetic Screening - Enroll Patient in Babyscripts - Misc. Devices (GOJJI WEIGHT SCALE) MISC; 1 Device by Does not apply route daily as needed. To weight self daily as needed at home. ICD-10 code: Z34.90  Dispense: 1 each; Refill: 0 - Blood Pressure Monitoring (BLOOD PRESSURE MONITOR AUTOMAT) DEVI; 1 Device by Does not apply route daily. Automatic blood pressure cuff regular size. To monitor blood pressure regularly at home. ICD-10 code:Z34.90  Dispense: 1 each; Refill: 0 - CBC/D/Plt+RPR+Rh+ABO+Rub Ab... - Rx for bASA 81 mg daily - Ambulatory Referral for Nutrition  3. [redacted] weeks gestation of pregnancy   Meds:  Meds ordered this encounter  Medications  . Misc. Devices (GOJJI WEIGHT SCALE) MISC    Sig: 1 Device by Does not apply route daily as needed. To weight self daily as needed at home. ICD-10 code: Z34.90    Dispense:  1 each    Refill:  0  . Blood Pressure Monitoring (BLOOD PRESSURE MONITOR AUTOMAT) DEVI    Sig: 1 Device by Does not apply route daily. Automatic blood pressure cuff regular size. To monitor blood pressure regularly at home. ICD-10 code:Z34.90    Dispense:  1 each    Refill:  0  . aspirin 81 MG chewable tablet    Sig: Chew 1 tablet (81 mg total) by mouth daily.    Dispense:  30 tablet    Refill:  6    Order Specific Question:   Supervising Provider    Answer:   Reva Bores [[id number]]    Initial labs obtained Continue prenatal vitamins Reviewed n/v relief measures and warning s/s to report Reviewed recommended weight gain based on pre-gravid BMI Encouraged well-balanced diet Genetic Screening discussed: ordered Cystic fibrosis, SMA, Fragile X screening discussed ordered The nature of [facility] with multiple MDs and other Advanced Practice Providers was explained to patient; also emphasized that residents, students are part of our team.   Discussed optimized OB schedule and video visits. Advised can have an in-office visit whenever she feels she needs to be seen.  Does not have own BP cuff. BP cuff Rx faxed today. Explained to patient that BP will be mailed to her house. Check BP weekly, let us know if >140/90. Advised to call during normal business hours and there is an after-hours nurse line available.    Follow-up: Return in about 4 weeks (around 10/23/2020) for Return OB - My Chart video.   Orders Placed This Encounter  Procedures  . Culture, OB Urine  . [place] MFM OB DETAIL +14 WK  . Hemoglobin A1c  . AFP, Serum, Open Spina Bifida  . Genetic Screening  . CBC/D/Plt+RPR+Rh+ABO+Rub Ab...  . Referral to Nutrition and Diabetes Services    Raelyn Mora MSN, [place] 09/25/2020

## 2020-09-25 NOTE — Patient Instructions (Signed)

## 2020-09-27 LAB — CBC/D/PLT+RPR+RH+ABO+RUB AB...
Antibody Screen: NEGATIVE
Basophils Absolute: 0.1 10*3/uL (ref 0.0–0.2)
Basos: 1 %
EOS (ABSOLUTE): 0 10*3/uL (ref 0.0–0.4)
Eos: 0 %
HCV Ab: <0.1 s/co ratio (ref 0.0–0.9)
HIV Screen 4th Generation wRfx: NONREACTIVE
Hematocrit: 33.8 % — ABNORMAL LOW (ref 34.0–46.6)
Hemoglobin: 10.6 g/dL — ABNORMAL LOW (ref 11.1–15.9)
Hepatitis B Surface Ag: NEGATIVE
Immature Grans (Abs): 0 10*3/uL (ref 0.0–0.1)
Immature Granulocytes: 0 %
Lymphocytes Absolute: 1.9 10*3/uL (ref 0.7–3.1)
Lymphs: 20 %
MCH: 25.7 pg — ABNORMAL LOW (ref 26.6–33.0)
MCHC: 31.4 g/dL — ABNORMAL LOW (ref 31.5–35.7)
MCV: 82 fL (ref 79–97)
Monocytes Absolute: 0.4 10*3/uL (ref 0.1–0.9)
Monocytes: 5 %
Neutrophils Absolute: 7.1 10*3/uL — ABNORMAL HIGH (ref 1.4–7.0)
Neutrophils: 74 %
Platelets: 274 10*3/uL (ref 150–450)
RBC: 4.12 x10E6/uL (ref 3.77–5.28)
RDW: 14.1 % (ref 11.7–15.4)
RPR Ser Ql: NONREACTIVE
Rh Factor: POSITIVE
Rubella Antibodies, IGG: 6.38 index (ref >0.99)
WBC: 9.6 10*3/uL (ref 3.4–10.8)

## 2020-09-27 LAB — AFP, SERUM, OPEN SPINA BIFIDA
AFP MoM: 2.42
AFP Value: 54 ng/mL
Gest. Age on Collection Date: 15 weeks
Maternal Age At EDD: 26.4 yr
OSBR Risk 1 IN: 671
Test Results:: NEGATIVE
Weight: 289 [lb_av]

## 2020-09-27 LAB — HEMOGLOBIN A1C
Est. average glucose Bld gHb Est-mCnc: 97 mg/dL
Hgb A1c MFr Bld: 5 % (ref 4.8–5.6)

## 2020-09-27 LAB — HCV INTERPRETATION

## 2020-09-27 LAB — URINE CULTURE, OB REFLEX

## 2020-09-27 LAB — CULTURE, OB URINE

## 2020-09-29 LAB — CERVICOVAGINAL ANCILLARY ONLY
Bacterial Vaginitis (gardnerella): POSITIVE — AB
Candida Glabrata: NEGATIVE
Candida Vaginitis: POSITIVE — AB
Chlamydia: POSITIVE — AB
Comment: NEGATIVE
Comment: NEGATIVE
Comment: NEGATIVE
Comment: NEGATIVE
Comment: NEGATIVE
Comment: NORMAL
Neisseria Gonorrhea: NEGATIVE
Trichomonas: NEGATIVE

## 2020-09-30 ENCOUNTER — Telehealth: Payer: Self-pay | Admitting: *Deleted

## 2020-09-30 DIAGNOSIS — B3731 Acute candidiasis of vulva and vagina: Secondary | ICD-10-CM

## 2020-09-30 DIAGNOSIS — B9689 Other specified bacterial agents as the cause of diseases classified elsewhere: Secondary | ICD-10-CM

## 2020-09-30 DIAGNOSIS — B373 Candidiasis of vulva and vagina: Secondary | ICD-10-CM

## 2020-09-30 DIAGNOSIS — A749 Chlamydial infection, unspecified: Secondary | ICD-10-CM

## 2020-09-30 DIAGNOSIS — N760 Acute vaginitis: Secondary | ICD-10-CM

## 2020-09-30 LAB — CYTOLOGY - PAP
Adequacy: ABSENT
Diagnosis: NEGATIVE

## 2020-09-30 MED ORDER — AZITHROMYCIN 500 MG PO TABS: 1000.0000 mg | ORAL_TABLET | Freq: Every day | ORAL | 0 refills | Status: AC

## 2020-09-30 MED ORDER — TERCONAZOLE 0.4 % VA CREA: 1.0000 | TOPICAL_CREAM | Freq: Every day | VAGINAL | 0 refills | Status: DC

## 2020-09-30 MED ORDER — METRONIDAZOLE 500 MG PO TABS: 500.0000 mg | ORAL_TABLET | Freq: Two times a day (BID) | ORAL | 0 refills | Status: DC

## 2020-09-30 NOTE — Telephone Encounter (Signed)
-----   Message from Raelyn Mora, [place] sent at 09/30/2020 11:59 AM EST ----- Please treat for Chlamydia, BV and then yeast

## 2020-10-02 ENCOUNTER — Other Ambulatory Visit: Payer: Self-pay | Admitting: Obstetrics and Gynecology

## 2020-10-02 DIAGNOSIS — L309 Dermatitis, unspecified: Secondary | ICD-10-CM

## 2020-10-13 ENCOUNTER — Encounter: Payer: Self-pay | Admitting: *Deleted

## 2020-10-20 ENCOUNTER — Encounter: Payer: Self-pay | Admitting: *Deleted

## 2020-10-20 ENCOUNTER — Ambulatory Visit: Payer: Medicaid Other | Attending: Obstetrics and Gynecology

## 2020-10-20 ENCOUNTER — Ambulatory Visit: Payer: Medicaid Other | Admitting: *Deleted

## 2020-10-20 ENCOUNTER — Other Ambulatory Visit: Payer: Self-pay

## 2020-10-20 DIAGNOSIS — O99210 Obesity complicating pregnancy, unspecified trimester: Secondary | ICD-10-CM | POA: Diagnosis present

## 2020-10-20 DIAGNOSIS — Z3480 Encounter for supervision of other normal pregnancy, unspecified trimester: Secondary | ICD-10-CM

## 2020-10-21 ENCOUNTER — Other Ambulatory Visit: Payer: Self-pay | Admitting: *Deleted

## 2020-10-21 DIAGNOSIS — R638 Other symptoms and signs concerning food and fluid intake: Secondary | ICD-10-CM

## 2020-10-23 ENCOUNTER — Encounter: Payer: Medicaid Other | Admitting: Obstetrics and Gynecology

## 2020-10-29 ENCOUNTER — Other Ambulatory Visit: Payer: Self-pay

## 2020-10-29 ENCOUNTER — Ambulatory Visit (INDEPENDENT_AMBULATORY_CARE_PROVIDER_SITE_OTHER): Payer: Medicaid Other | Admitting: Obstetrics and Gynecology

## 2020-10-29 ENCOUNTER — Other Ambulatory Visit (HOSPITAL_COMMUNITY)
Admission: RE | Admit: 2020-10-29 | Discharge: 2020-10-29 | Disposition: A | Discharge status: Home / Self Care | Payer: Medicaid Other | Source: Ambulatory Visit | Attending: Obstetrics and Gynecology | Admitting: Obstetrics and Gynecology

## 2020-10-29 VITALS — BP 119/76 | HR 90 | Temp 98.3°F | Wt 299.6 lb

## 2020-10-29 DIAGNOSIS — B9689 Other specified bacterial agents as the cause of diseases classified elsewhere: Secondary | ICD-10-CM | POA: Diagnosis not present

## 2020-10-29 DIAGNOSIS — N898 Other specified noninflammatory disorders of vagina: Secondary | ICD-10-CM | POA: Insufficient documentation

## 2020-10-29 DIAGNOSIS — O23591 Infection of other part of genital tract in pregnancy, first trimester: Secondary | ICD-10-CM | POA: Diagnosis not present

## 2020-10-29 DIAGNOSIS — Z113 Encounter for screening for infections with a predominantly sexual mode of transmission: Secondary | ICD-10-CM | POA: Diagnosis not present

## 2020-10-29 DIAGNOSIS — B373 Candidiasis of vulva and vagina: Secondary | ICD-10-CM | POA: Insufficient documentation

## 2020-10-29 DIAGNOSIS — O26899 Other specified pregnancy related conditions, unspecified trimester: Secondary | ICD-10-CM

## 2020-10-29 DIAGNOSIS — Z3A21 21 weeks gestation of pregnancy: Secondary | ICD-10-CM | POA: Diagnosis not present

## 2020-10-29 DIAGNOSIS — Z3480 Encounter for supervision of other normal pregnancy, unspecified trimester: Secondary | ICD-10-CM

## 2020-10-29 NOTE — Progress Notes (Signed)
   LOW-RISK PREGNANCY OFFICE VISIT Patient name: Jessica Stuart MRN [id number]  Date of birth: [birth year] Chief Complaint:   Routine Prenatal Visit  History of Present Illness:   Jessica Stuart is a 25 y.o. G94P0010 female at [redacted]w[redacted]d with an Estimated Date of Delivery: 03/07/21 being seen today for ongoing management of a low-risk pregnancy.  Today she reports no complaints. Contractions: Not present. Vag. Bleeding: None.  Movement: Present. denies leaking of fluid. Review of Systems:   Pertinent items are noted in HPI Denies abnormal vaginal discharge w/ itching/odor/irritation, headaches, visual changes, shortness of breath, chest pain, abdominal pain, severe nausea/vomiting, or problems with urination or bowel movements unless otherwise stated above. Pertinent History Reviewed:  Reviewed past medical,surgical, social, obstetrical and family history.  Reviewed problem list, medications and allergies. Physical Assessment:   Vitals:   10/29/20 1133  BP: 119/76  Pulse: 90  Temp: 98.3 F (36.8 C)  Weight: 299 lb 9.6 oz (135.9 kg)  Body mass index is 44.24 kg/m.        Physical Examination:   General appearance: Well appearing, and in no distress  Mental status: Alert, oriented to person, place, and time  Skin: Warm & dry  Cardiovascular: Normal heart rate noted  Respiratory: Normal respiratory effort, no distress  Abdomen: Soft, gravid, nontender  Pelvic: Cervical exam deferred         Extremities: Edema: None  Fetal Status: Fetal Heart Rate (bpm): 147 Fundal Height: 25 cm Movement: Present     Assessment & Plan:  1) Low-risk pregnancy G2P0010 at [redacted]w[redacted]d with an Estimated Date of Delivery: 03/07/21   2) Supervision of other normal pregnancy, antepartum - Next appt in office - Advised to bring BP cuff to next visit for sizing and proper use  3) Screen for STD (sexually transmitted disease)  - Cervicovaginal ancillary only( [place])  4) Vaginal discharge during pregnancy,  antepartum  - Cervicovaginal ancillary only( [place])  5) [redacted] weeks gestation of pregnancy    Meds: No orders of the defined types were placed in this encounter.  Labs/procedures today: GC/CT  Plan:  Continue routine obstetrical care   Reviewed: Preterm labor symptoms and general obstetric precautions including but not limited to vaginal bleeding, contractions, leaking of fluid and fetal movement were reviewed in detail with the patient.  All questions were answered. Has home bp cuff, but has not tried it Check bp weekly, let us know if >140/90.   Follow-up: Return in about 4 weeks (around 11/26/2020) for Return OB visit.  No orders of the defined types were placed in this encounter.  Raelyn Mora MSN, CNM 10/29/2020 11:47 AM

## 2020-10-31 LAB — CERVICOVAGINAL ANCILLARY ONLY
Bacterial Vaginitis (gardnerella): POSITIVE — AB
Candida Glabrata: NEGATIVE
Candida Vaginitis: POSITIVE — AB
Chlamydia: NEGATIVE
Comment: NEGATIVE
Comment: NEGATIVE
Comment: NEGATIVE
Comment: NEGATIVE
Comment: NEGATIVE
Comment: NORMAL
Neisseria Gonorrhea: NEGATIVE
Trichomonas: NEGATIVE

## 2020-11-01 ENCOUNTER — Other Ambulatory Visit (INDEPENDENT_AMBULATORY_CARE_PROVIDER_SITE_OTHER): Payer: Medicaid Other | Admitting: Obstetrics and Gynecology

## 2020-11-01 DIAGNOSIS — B9689 Other specified bacterial agents as the cause of diseases classified elsewhere: Secondary | ICD-10-CM

## 2020-11-01 DIAGNOSIS — B373 Candidiasis of vulva and vagina: Secondary | ICD-10-CM

## 2020-11-01 DIAGNOSIS — B3731 Acute candidiasis of vulva and vagina: Secondary | ICD-10-CM

## 2020-11-01 DIAGNOSIS — N760 Acute vaginitis: Secondary | ICD-10-CM

## 2020-11-01 MED ORDER — TERCONAZOLE 0.4 % VA CREA: 1.0000 | TOPICAL_CREAM | Freq: Every day | VAGINAL | 0 refills | Status: DC

## 2020-11-01 MED ORDER — METRONIDAZOLE 500 MG PO TABS: 500.0000 mg | ORAL_TABLET | Freq: Two times a day (BID) | ORAL | 0 refills | Status: DC

## 2020-11-01 NOTE — Progress Notes (Signed)
Notified via My Chart

## 2020-11-04 ENCOUNTER — Encounter: Payer: Self-pay | Admitting: *Deleted

## 2020-11-04 NOTE — Progress Notes (Signed)
Received prior authorization from Karin Golden for terazole 0.4% vaginal cream. PA was approved from [employer] via Covermymeds.com. PA approval number: [id number], coverage from 11/04/20-11/04/21.  Clovis Pu, RN

## 2020-11-17 ENCOUNTER — Encounter: Payer: Self-pay | Admitting: *Deleted

## 2020-11-17 ENCOUNTER — Other Ambulatory Visit: Payer: Self-pay | Admitting: Obstetrics

## 2020-11-17 ENCOUNTER — Ambulatory Visit: Payer: Medicaid Other | Admitting: *Deleted

## 2020-11-17 ENCOUNTER — Ambulatory Visit: Payer: Medicaid Other | Attending: Obstetrics

## 2020-11-17 ENCOUNTER — Other Ambulatory Visit: Payer: Self-pay

## 2020-11-17 DIAGNOSIS — O99213 Obesity complicating pregnancy, third trimester: Secondary | ICD-10-CM

## 2020-11-17 DIAGNOSIS — O4440 Low lying placenta NOS or without hemorrhage, unspecified trimester: Secondary | ICD-10-CM

## 2020-11-17 DIAGNOSIS — Z3A24 24 weeks gestation of pregnancy: Secondary | ICD-10-CM

## 2020-11-17 DIAGNOSIS — O99212 Obesity complicating pregnancy, second trimester: Secondary | ICD-10-CM

## 2020-11-17 DIAGNOSIS — O358XX0 Maternal care for other (suspected) fetal abnormality and damage, not applicable or unspecified: Secondary | ICD-10-CM

## 2020-11-17 DIAGNOSIS — Z3480 Encounter for supervision of other normal pregnancy, unspecified trimester: Secondary | ICD-10-CM | POA: Diagnosis present

## 2020-11-17 DIAGNOSIS — R638 Other symptoms and signs concerning food and fluid intake: Secondary | ICD-10-CM | POA: Diagnosis present

## 2020-11-17 DIAGNOSIS — E669 Obesity, unspecified: Secondary | ICD-10-CM

## 2020-11-17 DIAGNOSIS — Z3687 Encounter for antenatal screening for uncertain dates: Secondary | ICD-10-CM

## 2020-11-17 DIAGNOSIS — O43193 Other malformation of placenta, third trimester: Secondary | ICD-10-CM

## 2020-11-27 ENCOUNTER — Other Ambulatory Visit: Payer: Self-pay

## 2020-11-27 ENCOUNTER — Encounter: Payer: Self-pay | Admitting: Obstetrics and Gynecology

## 2020-11-27 ENCOUNTER — Ambulatory Visit (INDEPENDENT_AMBULATORY_CARE_PROVIDER_SITE_OTHER): Payer: Medicaid Other | Admitting: Obstetrics and Gynecology

## 2020-11-27 VITALS — BP 113/70 | HR 83 | Temp 97.7°F | Wt 301.4 lb

## 2020-11-27 DIAGNOSIS — O99210 Obesity complicating pregnancy, unspecified trimester: Secondary | ICD-10-CM

## 2020-11-27 DIAGNOSIS — Z3480 Encounter for supervision of other normal pregnancy, unspecified trimester: Secondary | ICD-10-CM

## 2020-11-27 DIAGNOSIS — Z3A25 25 weeks gestation of pregnancy: Secondary | ICD-10-CM

## 2020-11-27 NOTE — Patient Instructions (Signed)
Oral Glucose Tolerance Test During Pregnancy Why am I having this test? The oral glucose tolerance test (OGTT) is done to check how your body processes blood sugar (glucose). This is one of several tests used to diagnose diabetes that develops during pregnancy (gestational diabetes mellitus). Gestational diabetes is a short-term form of diabetes that some women develop while they are pregnant. It usually occurs during the second trimester of pregnancy and goes away after delivery. Testing, or screening, for gestational diabetes usually occurs at weeks 24-28 of pregnancy. You may have the OGTT test after having a 1-hour glucose screening test if the results from that test indicate that you may have gestational diabetes. This test may also be needed if:  You have a history of gestational diabetes.  There is a history of giving birth to very large babies or of losing pregnancies (having stillbirths).  You have signs and symptoms of diabetes, such as: ? Changes in your eyesight. ? Tingling or numbness in your hands or feet. ? Changes in hunger, thirst, and urination, and these are not explained by your pregnancy. What is being tested? This test measures the amount of glucose in your blood at different times during a period of 3 hours. This shows how well your body can process glucose. What kind of sample is taken? Blood samples are required for this test. They are usually collected by inserting a needle into a blood vessel.   How do I prepare for this test?  For 3 days before your test, eat normally. Have plenty of carbohydrate-rich foods.  Follow instructions from your health care provider about: ? Eating or drinking restrictions on the day of the test. You may be asked not to eat or drink anything other than water (to fast) starting 8-10 hours before the test. ? Changing or stopping your regular medicines. Some medicines may interfere with this test. Tell a health care provider about:  All  medicines you are taking, including vitamins, herbs, eye drops, creams, and over-the-counter medicines.  Any blood disorders you have.  Any surgeries you have had.  Any medical conditions you have. What happens during the test? First, your blood glucose will be measured. This is referred to as your fasting blood glucose because you fasted before the test. Then, you will drink a glucose solution that contains a certain amount of glucose. Your blood glucose will be measured again 1, 2, and 3 hours after you drink the solution. This test takes about 3 hours to complete. You will need to stay at the testing location during this time. During the testing period:  Do not eat or drink anything other than the glucose solution.  Do not exercise.  Do not use any products that contain nicotine or tobacco, such as cigarettes, e-cigarettes, and chewing tobacco. These can affect your test results. If you need help quitting, ask your health care provider. The testing procedure may vary among health care providers and hospitals. How are the results reported? Your results will be reported as milligrams of glucose per deciliter of blood (mg/dL) or millimoles per liter (mmol/L). There is more than one source for screening and diagnosis reference values used to diagnose gestational diabetes. Your health care provider will compare your results to normal values that were established after testing a large group of people (reference values). Reference values may vary among labs and hospitals. For this test ([facility]), reference values are:  Fasting: 95 mg/dL (5.3 mmol/L).  1 hour: 180 mg/dL (10.0 mmol/L).  2 hour:   155 mg/dL (8.6 mmol/L).  3 hour: 140 mg/dL (7.8 mmol/L). What do the results mean? Results below the reference values are considered normal. If two or more of your blood glucose levels are at or above the reference values, you may be diagnosed with gestational diabetes. If only one level is  high, your health care provider may suggest repeat testing or other tests to confirm a diagnosis. Talk with your health care provider about what your results mean. Questions to ask your health care provider Ask your health care provider, or the department that is doing the test:  When will my results be ready?  How will I get my results?  What are my treatment options?  What other tests do I need?  What are my next steps? Summary  The oral glucose tolerance test (OGTT) is one of several tests used to diagnose diabetes that develops during pregnancy (gestational diabetes mellitus). Gestational diabetes is a short-term form of diabetes that some women develop while they are pregnant.  You may have the OGTT test after having a 1-hour glucose screening test if the results from that test show that you may have gestational diabetes. You may also have this test if you have any symptoms or risk factors for this type of diabetes.  Talk with your health care provider about what your results mean. This information is not intended to replace advice given to you by your health care provider. Make sure you discuss any questions you have with your health care provider. Document Revised: 02/07/2020 Document Reviewed: 02/07/2020 Elsevier Patient Education  2021 Elsevier Inc.  

## 2020-11-27 NOTE — Progress Notes (Signed)
   LOW-RISK PREGNANCY OFFICE VISIT Patient name: Jessica Stuart MRN [id number]  Date of birth: 1996-04-11 Chief Complaint:   Routine Prenatal Visit  History of Present Illness:   Jessica Stuart is a 25 y.o. G65P0010 female at [redacted]w[redacted]d with an Estimated Date of Delivery: 03/07/21 being seen today for ongoing management of a low-risk pregnancy.  Today she reports no complaints. She reports having occasional "butt cramps." She reports that this is NOT a new symptom; she had these same symptoms prior to pregnancy. Contractions: Not present. Vag. Bleeding: None.  Movement: Present. denies leaking of fluid. Review of Systems:   Pertinent items are noted in HPI Denies abnormal vaginal discharge w/ itching/odor/irritation, headaches, visual changes, shortness of breath, chest pain, abdominal pain, severe nausea/vomiting, or problems with urination or bowel movements unless otherwise stated above. Pertinent History Reviewed:  Reviewed past medical,surgical, social, obstetrical and family history.  Reviewed problem list, medications and allergies. Physical Assessment:   Vitals:   11/27/20 0944  BP: 113/70  Pulse: 83  Temp: 97.7 F (36.5 C)  Weight: (!) 301 lb 6.4 oz (136.7 kg)  Body mass index is 44.51 kg/m.        Physical Examination:   General appearance: Well appearing, and in no distress  Mental status: Alert, oriented to person, place, and time  Skin: Warm & dry  Cardiovascular: Normal heart rate noted  Respiratory: Normal respiratory effort, no distress  Abdomen: Soft, gravid, nontender  Pelvic: Cervical exam deferred         Extremities: Edema: None  Fetal Status: Fetal Heart Rate (bpm): 143   Movement: Present    No results found for this or any previous visit (from the past 24 hour(s)).  Assessment & Plan:  1) Low-risk pregnancy G2P0010 at [redacted]w[redacted]d with an Estimated Date of Delivery: 03/07/21   2) Supervision of other normal pregnancy, antepartum  3) [redacted] weeks gestation of  pregnancy  4) Obesity in pregnancy, antepartum - Growth U/S scheduled for 4/4 - Taking bASA   Meds: No orders of the defined types were placed in this encounter.  Labs/procedures today: none  Plan:  Continue routine obstetrical care   Reviewed: Preterm labor symptoms and general obstetric precautions including but not limited to vaginal bleeding, contractions, leaking of fluid and fetal movement were reviewed in detail with the patient.  All questions were answered. Has home bp cuff. Check bp weekly, let us know if >140/90.   Follow-up: Return in about 3 weeks (around 12/18/2020) for Return OB 2hr GTT.  No orders of the defined types were placed in this encounter.  Raelyn Mora MSN, CNM 11/27/2020 10:17 AM

## 2020-12-18 ENCOUNTER — Encounter: Payer: Medicaid Other | Admitting: Obstetrics and Gynecology

## 2020-12-18 ENCOUNTER — Telehealth: Payer: Self-pay | Admitting: Obstetrics and Gynecology

## 2020-12-18 NOTE — Telephone Encounter (Signed)
Attempted to reach patient to get her rescheduled for her missed OB appointment. Sent patient a message to call, and reschedule.

## 2020-12-23 ENCOUNTER — Encounter: Payer: Self-pay | Admitting: *Deleted

## 2020-12-23 ENCOUNTER — Ambulatory Visit: Payer: Medicaid Other | Attending: Obstetrics | Admitting: *Deleted

## 2020-12-23 ENCOUNTER — Ambulatory Visit (HOSPITAL_BASED_OUTPATIENT_CLINIC_OR_DEPARTMENT_OTHER): Payer: Medicaid Other

## 2020-12-23 ENCOUNTER — Other Ambulatory Visit: Payer: Self-pay

## 2020-12-23 DIAGNOSIS — O43193 Other malformation of placenta, third trimester: Secondary | ICD-10-CM | POA: Diagnosis not present

## 2020-12-23 DIAGNOSIS — O99213 Obesity complicating pregnancy, third trimester: Secondary | ICD-10-CM | POA: Insufficient documentation

## 2020-12-23 DIAGNOSIS — Z3687 Encounter for antenatal screening for uncertain dates: Secondary | ICD-10-CM | POA: Insufficient documentation

## 2020-12-23 DIAGNOSIS — Z148 Genetic carrier of other disease: Secondary | ICD-10-CM

## 2020-12-23 DIAGNOSIS — Z3480 Encounter for supervision of other normal pregnancy, unspecified trimester: Secondary | ICD-10-CM

## 2020-12-23 DIAGNOSIS — Z3A29 29 weeks gestation of pregnancy: Secondary | ICD-10-CM

## 2020-12-23 DIAGNOSIS — E669 Obesity, unspecified: Secondary | ICD-10-CM | POA: Diagnosis not present

## 2020-12-24 ENCOUNTER — Other Ambulatory Visit: Payer: Self-pay | Admitting: *Deleted

## 2020-12-24 DIAGNOSIS — O43193 Other malformation of placenta, third trimester: Secondary | ICD-10-CM

## 2020-12-25 ENCOUNTER — Other Ambulatory Visit: Payer: Self-pay | Admitting: *Deleted

## 2020-12-25 ENCOUNTER — Encounter: Payer: Medicaid Other | Admitting: Obstetrics and Gynecology

## 2020-12-25 ENCOUNTER — Ambulatory Visit: Payer: Medicaid Other

## 2020-12-25 DIAGNOSIS — O43199 Other malformation of placenta, unspecified trimester: Secondary | ICD-10-CM

## 2021-01-02 ENCOUNTER — Other Ambulatory Visit: Payer: Self-pay

## 2021-01-02 ENCOUNTER — Ambulatory Visit (INDEPENDENT_AMBULATORY_CARE_PROVIDER_SITE_OTHER): Payer: Medicaid Other | Admitting: Women's Health

## 2021-01-02 VITALS — BP 107/69 | HR 88 | Temp 98.1°F | Wt 298.0 lb

## 2021-01-02 DIAGNOSIS — Z3A30 30 weeks gestation of pregnancy: Secondary | ICD-10-CM

## 2021-01-02 DIAGNOSIS — O98519 Other viral diseases complicating pregnancy, unspecified trimester: Secondary | ICD-10-CM

## 2021-01-02 DIAGNOSIS — B009 Herpesviral infection, unspecified: Secondary | ICD-10-CM

## 2021-01-02 DIAGNOSIS — Z3480 Encounter for supervision of other normal pregnancy, unspecified trimester: Secondary | ICD-10-CM

## 2021-01-02 DIAGNOSIS — D563 Thalassemia minor: Secondary | ICD-10-CM | POA: Insufficient documentation

## 2021-01-02 DIAGNOSIS — O99210 Obesity complicating pregnancy, unspecified trimester: Secondary | ICD-10-CM

## 2021-01-02 DIAGNOSIS — O43199 Other malformation of placenta, unspecified trimester: Secondary | ICD-10-CM | POA: Insufficient documentation

## 2021-01-02 NOTE — Patient Instructions (Signed)
Maternity Assessment Unit (MAU)  The Maternity Assessment Unit (MAU) is located at the [facility] and [place] [facility]. The address is: [address], [place], [place], [place] [address]. Please see map below for additional directions.    The Maternity Assessment Unit is designed to help you during your pregnancy, and for up to 6 weeks after delivery, with any pregnancy- or postpartum-related emergencies, if you think you are in labor, or if your water has broken. For example, if you experience nausea and vomiting, vaginal bleeding, severe abdominal or pelvic pain, elevated blood pressure or other problems related to your pregnancy or postpartum time, please come to the Maternity Assessment Unit for assistance.        Preterm Labor The normal length of a pregnancy is 39-41 weeks. Preterm labor is when labor starts before 37 completed weeks of pregnancy. Babies who are born prematurely and survive may not be fully developed and may be at an increased risk for long-term problems such as cerebral palsy, developmental delays, and vision and hearing problems. Babies who are born too early may have problems soon after birth. Problems may include regulating blood sugar, body temperature, heart rate, and breathing rate. These babies often have trouble with feeding. The risk of having problems is highest for babies who are born before 44 weeks of pregnancy. What are the causes? The exact cause of this condition is not known. What increases the risk? You are more likely to have preterm labor if you have certain risk factors that relate to your medical history, problems with present and past pregnancies, and lifestyle factors. Medical history  You have abnormalities of the uterus, including a short cervix.  You have STIs (sexually transmitted infections), or other infections of the urinary tract and the vagina.  You have chronic illnesses, such as blood clotting problems,  diabetes, or high blood pressure.  You are overweight or underweight. Present and past pregnancies  You have had preterm labor before.  You are pregnant with twins or other multiples.  You have been diagnosed with a condition in which the placenta covers your cervix (placenta previa).  You waited less than 6 months between giving birth and becoming pregnant again.  Your unborn baby has some abnormalities.  You have vaginal bleeding during pregnancy.  You became pregnant through in vitro fertilization (IVF). Lifestyle and environmental factors  You use tobacco products.  You drink alcohol.  You use street drugs.  You have stress and no social support.  You experience domestic violence.  You are exposed to certain chemicals or environmental pollutants. Other factors  You are younger than age 110 or older than age 19. What are the signs or symptoms? Symptoms of this condition include:  Cramps similar to those that can happen during a menstrual period. The cramps may happen with diarrhea.  Pain in the abdomen or lower back.  Regular contractions that may feel like tightening of the abdomen.  A feeling of increased pressure in the pelvis.  Increased watery or bloody mucus discharge from the vagina.  Water breaking (ruptured amniotic sac). How is this diagnosed? This condition is diagnosed based on:  Your medical history and a physical exam.  A pelvic exam.  An ultrasound.  Monitoring your uterus for contractions.  Other tests, including: ? A swab of the cervix to check for a chemical called fetal fibronectin. ? Urine tests. How is this treated? Treatment for this condition depends on the length of your pregnancy, your  condition, and the health of your baby. Treatment may include:  Taking medicines, such as: ? Hormone medicines. These may be given early in pregnancy to help support the pregnancy. ? Medicines to stop contractions. ? Medicines to help mature  the baby's lungs. These may be prescribed if the risk of delivery is high. ? Medicines to prevent your baby from developing cerebral palsy.  Bed rest. If the labor happens before 34 weeks of pregnancy, you may need to stay in the hospital.  Delivery of the baby. Follow these instructions at home:  Do not use any products that contain nicotine or tobacco, such as cigarettes, e-cigarettes, and chewing tobacco. If you need help quitting, ask your health care provider.  Do not drink alcohol.  Take over-the-counter and prescription medicines only as told by your health care provider.  Rest as told by your health care provider.  Return to your normal activities as told by your health care provider. Ask your health care provider what activities are safe for you.  Keep all follow-up visits as told by your health care provider. This is important.   How is this prevented? To increase your chance of having a full-term pregnancy:  Do not use street drugs or medicines that have not been prescribed to you during your pregnancy.  Talk with your health care provider before taking any herbal supplements, even if you have been taking them regularly.  Make sure you gain a healthy amount of weight during your pregnancy.  Watch for infection. If you think that you might have an infection, get it checked right away. Symptoms of infection may include: ? Fever. ? Abnormal vaginal discharge or discharge that smells bad. ? Pain or burning with urination. ? Needing to urinate urgently. ? Frequently urinating or passing small amounts of urine frequently. ? Blood in your urine. ? Urine that smells bad or unusual.  Tell your health care provider if you have had preterm labor before. Contact a health care provider if:  You think you are going into preterm labor.  You have signs or symptoms of preterm labor.  You have symptoms of infection. Get help right away if:  You are having regular, painful  contractions every 5 minutes or less.  Your water breaks. Summary  Preterm labor is labor that starts before you reach 37 weeks of pregnancy.  Delivering your baby early increases your baby's risk of developing lifelong problems.  The exact cause of preterm labor is unknown. However, having an abnormal uterus, an STI (sexually transmitted infection), or vaginal bleeding during pregnancy increases your risk for preterm labor.  Keep all follow-up visits as told by your health care provider. This is important.  Contact a health care provider if you have signs or symptoms of preterm labor. This information is not intended to replace advice given to you by your health care provider. Make sure you discuss any questions you have with your health care provider. Document Revised: 10/02/2019 Document Reviewed: 10/02/2019 Elsevier Patient Education  2021 Elsevier Inc.        Oral Glucose Tolerance Test During Pregnancy Why am I having this test? The oral glucose tolerance test (OGTT) is done to check how your body processes blood sugar (glucose). This is one of several tests used to diagnose diabetes that develops during pregnancy (gestational diabetes mellitus). Gestational diabetes is a short-term form of diabetes that some women develop while they are pregnant. It usually occurs during the second trimester of pregnancy and goes away after  delivery. Testing, or screening, for gestational diabetes usually occurs at weeks 24-28 of pregnancy. You may have the OGTT test after having a 1-hour glucose screening test if the results from that test indicate that you may have gestational diabetes. This test may also be needed if:  You have a history of gestational diabetes.  There is a history of giving birth to very large babies or of losing pregnancies (having stillbirths).  You have signs and symptoms of diabetes, such as: ? Changes in your eyesight. ? Tingling or numbness in your hands or  feet. ? Changes in hunger, thirst, and urination, and these are not explained by your pregnancy. What is being tested? This test measures the amount of glucose in your blood at different times during a period of 3 hours. This shows how well your body can process glucose. What kind of sample is taken? Blood samples are required for this test. They are usually collected by inserting a needle into a blood vessel.   How do I prepare for this test?  For 3 days before your test, eat normally. Have plenty of carbohydrate-rich foods.  Follow instructions from your health care provider about: ? Eating or drinking restrictions on the day of the test. You may be asked not to eat or drink anything other than water (to fast) starting 8-10 hours before the test. ? Changing or stopping your regular medicines. Some medicines may interfere with this test. Tell a health care provider about:  All medicines you are taking, including vitamins, herbs, eye drops, creams, and over-the-counter medicines.  Any blood disorders you have.  Any surgeries you have had.  Any medical conditions you have. What happens during the test? First, your blood glucose will be measured. This is referred to as your fasting blood glucose because you fasted before the test. Then, you will drink a glucose solution that contains a certain amount of glucose. Your blood glucose will be measured again 1, 2, and 3 hours after you drink the solution. This test takes about 3 hours to complete. You will need to stay at the testing location during this time. During the testing period:  Do not eat or drink anything other than the glucose solution.  Do not exercise.  Do not use any products that contain nicotine or tobacco, such as cigarettes, e-cigarettes, and chewing tobacco. These can affect your test results. If you need help quitting, ask your health care provider. The testing procedure may vary among health care providers and  hospitals. How are the results reported? Your results will be reported as milligrams of glucose per deciliter of blood (mg/dL) or millimoles per liter (mmol/L). There is more than one source for screening and diagnosis reference values used to diagnose gestational diabetes. Your health care provider will compare your results to normal values that were established after testing a large group of people (reference values). Reference values may vary among labs and hospitals. For this test (Carpenter-Coustan), reference values are:  Fasting: 95 mg/dL (5.3 mmol/L).  1 hour: 180 mg/dL (67.6 mmol/L).  2 hour: 155 mg/dL (8.6 mmol/L).  3 hour: 140 mg/dL (7.8 mmol/L). What do the results mean? Results below the reference values are considered normal. If two or more of your blood glucose levels are at or above the reference values, you may be diagnosed with gestational diabetes. If only one level is high, your health care provider may suggest repeat testing or other tests to confirm a diagnosis. Talk with your health care provider  about what your results mean. Questions to ask your health care provider Ask your health care provider, or the department that is doing the test:  When will my results be ready?  How will I get my results?  What are my treatment options?  What other tests do I need?  What are my next steps? Summary  The oral glucose tolerance test (OGTT) is one of several tests used to diagnose diabetes that develops during pregnancy (gestational diabetes mellitus). Gestational diabetes is a short-term form of diabetes that some women develop while they are pregnant.  You may have the OGTT test after having a 1-hour glucose screening test if the results from that test show that you may have gestational diabetes. You may also have this test if you have any symptoms or risk factors for this type of diabetes.  Talk with your health care provider about what your results mean. This  information is not intended to replace advice given to you by your health care provider. Make sure you discuss any questions you have with your health care provider. Document Revised: 02/07/2020 Document Reviewed: 02/07/2020 Elsevier Patient Education  2021 Elsevier Inc.        Pregnancy and Genital Herpes  Genital herpes is an STI (sexually transmitted infection) that is caused by the herpes simplex virus (HSV). HSV can cause an outbreak of itching, blisters, and sores (ulcers) around the genitals and rectum. Even when the outbreak goes away, the virus stays in the body. If you are pregnant, you can pass HSV to your baby. If you become infected with HSV for the first time while you are pregnant, the virus can cause serious problems for your baby. If you had HSV before your pregnancy, the virus may not affect your baby as seriously. Babies that are infected with HSV are at risk for developing inflammation of the brain (encephalitis), damage to organs, and problems with development. How does this affect me? Your type of delivery may be affected. You may be able to have a vaginal delivery if you have no evidence of an outbreak when you go into labor. However, your baby may need to be delivered by C-section (cesarean delivery) if you have:  An active, recurrent, or new herpes outbreak at the time of delivery. This is because the virus can pass to your baby through an infected birth canal. This can cause severe problems for your baby.  Any symptoms of infection in the areas around the genitals such as pain, burning, and itching, even if you do not have any ulcers in the birth canal. After delivery, you can breastfeed your baby. The virus will not be present in breast milk. While caring for your baby, you will need to take steps to avoid passing the virus on to your baby. How does this affect my baby? If the virus passes to your baby, it can cause serious problems. The virus can be passed to your  baby:  Before delivery. The virus can be passed to your unborn baby through the placenta. This is more likely to happen if you get herpes for the first time in the first 3 months of pregnancy (first trimester). This may cause your baby to have a congenital disability.  During delivery. This is more likely to happen if you become infected for the first time late in your pregnancy.  After delivery. Your baby can get a herpes infection if you touch active ulcers and then touch your baby without washing your hands. The  virus is less likely to pass to your baby if you had herpes before you became pregnant. This is because antibodies against the virus develop over a period of time. These antibodies help to protect the baby. How is this treated? This condition can be treated with medicines during pregnancy that are safe for you and your baby. These medicines can help to reduce symptoms, shorten an outbreak, and prevent another outbreak of the infection. If the infection happened before you became pregnant, you may need to take medicine late in your pregnancy to help to prevent a breakout at the time of delivery. Follow these instructions at home: To avoid passing the virus to your baby:  Wash your hands with soap and water often and before touching your baby.  If you have an outbreak, keep the area clean and covered.  If ulcers are present on your breast, do not breastfeed from the affected breast. Contact a health care provider if:  You have a rash, blisters, or ulcers in the area around your genitals or rectum.  You have burning, itching, or pain in the area around your genitals or rectum.  You have trouble urinating. Summary  Genital herpes is an STI (sexually transmitted infection) that is caused by the herpes simplex virus (HSV). If you are pregnant, you can pass the virus to your baby.  Even when the outbreak goes away, the virus stays in your body.  Genital herpes can be passed to your  unborn or newborn baby and cause serious problems.  This condition can be treated with medicines during pregnancy that are safe for you and your baby. Medicines can treat your symptoms, shorten the length of an outbreak, and prevent another outbreak of the infection.  If you have signs or symptoms of a herpes outbreak when you go into labor, your health care provider may recommend a C-section (cesarean delivery) to lower the risk of passing the virus to your baby. This information is not intended to replace advice given to you by your health care provider. Make sure you discuss any questions you have with your health care provider. Document Revised: 12/22/2018 Document Reviewed: 11/30/2018 Elsevier Patient Education  2021 [employer].

## 2021-01-02 NOTE — Progress Notes (Signed)
Subjective:  Jessica Stuart is a 25 y.o. G2P0010 at [redacted]w[redacted]d being seen today for ongoing prenatal care.  She is currently monitored for the following issues for this low-risk pregnancy and has Herpes genitalis in women; Supervision of other normal pregnancy, antepartum; Herpes simplex type 2 (HSV-2) infection affecting pregnancy, antepartum; Obesity in pregnancy, antepartum; Alpha thalassemia silent carrier; and Marginal insertion of umbilical cord affecting management of mother on their problem list.  Patient reports no complaints.  Contractions: Not present. Vag. Bleeding: None.  Movement: Present. Denies leaking of fluid.   The following portions of the patient's history were reviewed and updated as appropriate: allergies, current medications, past family history, past medical history, past social history, past surgical history and problem list. Problem list updated.  Objective:   Vitals:   01/02/21 0815  BP: 107/69  Pulse: 88  Temp: 98.1 F (36.7 C)  Weight: 298 lb (135.2 kg)    Fetal Status: Fetal Heart Rate (bpm): 145   Movement: Present     General:  Alert, oriented and cooperative. Patient is in no acute distress.  Skin: Skin is warm and dry. No rash noted.   Cardiovascular: Normal heart rate noted  Respiratory: Normal respiratory effort, no problems with respiration noted  Abdomen: Soft, gravid, appropriate for gestational age. Pain/Pressure: Absent     Pelvic: Vag. Bleeding: None Vag D/C Character: Other (Comment)   Cervical exam deferred        Extremities: Normal range of motion.  Edema: None  Mental Status: Normal mood and affect. Normal behavior. Normal judgment and thought content.   Urinalysis:      Assessment and Plan:  Pregnancy: G2P0010 at [redacted]w[redacted]d  1. Supervision of other normal pregnancy, antepartum - Glucose Tolerance, 2 Hours w/1 Hour - HIV Antibody (routine testing w rflx) - RPR - CBC  2. [redacted] weeks gestation of pregnancy - Glucose Tolerance, 2 Hours w/1  Hour - HIV Antibody (routine testing w rflx) - RPR - CBC  3. Alpha thalassemia silent carrier -declined GC  4. Marginal insertion of umbilical cord affecting management of mother -growth scan scheduled 01/28/2021  5. Obesity in pregnancy, antepartum -on low dose ASA  6. Herpes simplex type 2 (HSV-2) infection affecting pregnancy, antepartum -suppression at 35/36 weeks  Preterm labor symptoms and general obstetric precautions including but not limited to vaginal bleeding, contractions, leaking of fluid and fetal movement were reviewed in detail with the patient. I discussed the assessment and treatment plan with the patient. The patient was provided an opportunity to ask questions and all were answered. The patient agreed with the plan and demonstrated an understanding of the instructions. The patient was advised to call back or seek an in-person office evaluation/go to MAU at [facility] for any urgent or concerning symptoms. Please refer to After Visit Summary for other counseling recommendations.  Return in about 2 weeks (around 01/16/2021) for in-person LOB/APP OK.   Kaid Seeberger, Odie Sera, NP

## 2021-01-03 LAB — RPR: RPR Ser Ql: NONREACTIVE

## 2021-01-03 LAB — GLUCOSE TOLERANCE, 2 HOURS W/ 1HR
Glucose, 1 hour: 127 mg/dL (ref 65–179)
Glucose, 2 hour: 104 mg/dL (ref 65–152)
Glucose, Fasting: 74 mg/dL (ref 65–91)

## 2021-01-03 LAB — CBC
Hematocrit: 35.2 % (ref 34.0–46.6)
Hemoglobin: 11 g/dL — ABNORMAL LOW (ref 11.1–15.9)
MCH: 25.2 pg — ABNORMAL LOW (ref 26.6–33.0)
MCHC: 31.3 g/dL — ABNORMAL LOW (ref 31.5–35.7)
MCV: 81 fL (ref 79–97)
Platelets: 257 10*3/uL (ref 150–450)
RBC: 4.36 x10E6/uL (ref 3.77–5.28)
RDW: 14.3 % (ref 11.7–15.4)
WBC: 9.8 10*3/uL (ref 3.4–10.8)

## 2021-01-03 LAB — HIV ANTIBODY (ROUTINE TESTING W REFLEX): HIV Screen 4th Generation wRfx: NONREACTIVE

## 2021-01-21 ENCOUNTER — Ambulatory Visit (INDEPENDENT_AMBULATORY_CARE_PROVIDER_SITE_OTHER): Payer: Medicaid Other

## 2021-01-21 ENCOUNTER — Other Ambulatory Visit: Payer: Self-pay

## 2021-01-21 VITALS — BP 116/76 | HR 91 | Temp 98.0°F | Wt 303.8 lb

## 2021-01-21 DIAGNOSIS — B009 Herpesviral infection, unspecified: Secondary | ICD-10-CM

## 2021-01-21 DIAGNOSIS — Z3480 Encounter for supervision of other normal pregnancy, unspecified trimester: Secondary | ICD-10-CM

## 2021-01-21 DIAGNOSIS — O98519 Other viral diseases complicating pregnancy, unspecified trimester: Secondary | ICD-10-CM

## 2021-01-21 DIAGNOSIS — Z3A33 33 weeks gestation of pregnancy: Secondary | ICD-10-CM

## 2021-01-21 MED ORDER — VALACYCLOVIR HCL 500 MG PO TABS: 500.0000 mg | ORAL_TABLET | Freq: Two times a day (BID) | ORAL | 1 refills | Status: DC

## 2021-01-21 NOTE — Progress Notes (Signed)
   LOW-RISK PREGNANCY OFFICE VISIT  Patient name: Jessica Stuart MRN [id number]  Date of birth: 1996-03-05 Chief Complaint:   Routine Prenatal Visit  Subjective:   Jessica Stuart is a 25 y.o. G40P0010 female at [redacted]w[redacted]d with an Estimated Date of Delivery: 03/07/21 being seen today for ongoing management of a low-risk pregnancy aeb has Herpes genitalis in women; Supervision of other normal pregnancy, antepartum; Herpes simplex type 2 (HSV-2) infection affecting pregnancy, antepartum; Obesity in pregnancy, antepartum; Alpha thalassemia silent carrier; and Marginal insertion of umbilical cord affecting management of mother on their problem list.  Patient presents today with no complaints.  Patient endorses fetal movement. Patient denies abdominal cramping or contractions.  Patient denies vaginal concerns including abnormal discharge, leaking of fluid, and bleeding.  Contractions: Not present. Vag. Bleeding: None.  Movement: Present.  Reviewed past medical,surgical, social, obstetrical and family history as well as problem list, medications and allergies.  Objective   Vitals:   01/21/21 1442  BP: 116/76  Pulse: 91  Temp: 98 F (36.7 C)  Weight: (!) 303 lb 12.8 oz (137.8 kg)  Body mass index is 44.86 kg/m.  Total Weight Gain:-16 lb 3.2 oz (-7.348 kg)         Physical Examination:   General appearance: Well appearing, and in no distress  Mental status: Alert, oriented to person, place, and time  Skin: Warm & dry  Cardiovascular: Normal heart rate noted  Respiratory: Normal respiratory effort, no distress  Abdomen: Soft, gravid, nontender, LGA with Fundal Height: 38 cm  Pelvic: Cervical exam deferred           Extremities: Edema: None  Fetal Status: Fetal Heart Rate (bpm): 147  Movement: Present   No results found for this or any previous visit (from the past 24 hour(s)).  Assessment & Plan:  Low-risk pregnancy of a 25 y.o., G2P0010 at [redacted]w[redacted]d with an Estimated Date of Delivery:  03/07/21   1. Supervision of other normal pregnancy, antepartum -Anticipatory guidance for upcoming appts. -Patient to next appt in 3 weeks for an in-person visit. -Informed that next appt will obtain GBS swab as well as GC/CT -Educated on GBS bacteria including what it is, why we test, and how and when we treat if needed. -Will give list of pediatricians.  2. [redacted] weeks gestation of pregnancy -Doing well  3. Herpes simplex type 2 (HSV-2) infection affecting pregnancy, antepartum -Rx for Valtrex suppression sent to pharmacy on file.  -Patient instructed to start on Sunday May 29th.     Meds:  Meds ordered this encounter  Medications  . valACYclovir (VALTREX) 500 MG tablet    Sig: Take 1 tablet (500 mg total) by mouth 2 (two) times daily.    Dispense:  60 tablet    Refill:  1    Order Specific Question:   Supervising Provider    Answer:   Reva Bores [[id number]]   Labs/procedures today:  Lab Orders  No laboratory test(s) ordered today     Reviewed: Preterm labor symptoms and general obstetric precautions including but not limited to vaginal bleeding, contractions, leaking of fluid and fetal movement were reviewed in detail with the patient.  All questions were answered.  Follow-up: Return in about 3 weeks (around 02/11/2021) for LROB with GBS.  No orders of the defined types were placed in this encounter.  Cherre Robins MSN, CNM 01/21/2021

## 2021-01-21 NOTE — Patient Instructions (Signed)

## 2021-01-28 ENCOUNTER — Ambulatory Visit: Payer: Medicaid Other | Attending: Obstetrics

## 2021-01-28 ENCOUNTER — Ambulatory Visit: Payer: Medicaid Other | Admitting: *Deleted

## 2021-01-28 ENCOUNTER — Encounter: Payer: Self-pay | Admitting: *Deleted

## 2021-01-28 ENCOUNTER — Other Ambulatory Visit: Payer: Self-pay

## 2021-01-28 DIAGNOSIS — Z3687 Encounter for antenatal screening for uncertain dates: Secondary | ICD-10-CM

## 2021-01-28 DIAGNOSIS — D563 Thalassemia minor: Secondary | ICD-10-CM | POA: Diagnosis present

## 2021-01-28 DIAGNOSIS — O43199 Other malformation of placenta, unspecified trimester: Secondary | ICD-10-CM | POA: Insufficient documentation

## 2021-01-28 DIAGNOSIS — O09213 Supervision of pregnancy with history of pre-term labor, third trimester: Secondary | ICD-10-CM

## 2021-01-28 DIAGNOSIS — Z3480 Encounter for supervision of other normal pregnancy, unspecified trimester: Secondary | ICD-10-CM | POA: Diagnosis present

## 2021-01-28 DIAGNOSIS — Z3A34 34 weeks gestation of pregnancy: Secondary | ICD-10-CM

## 2021-01-28 DIAGNOSIS — E669 Obesity, unspecified: Secondary | ICD-10-CM | POA: Diagnosis not present

## 2021-01-28 DIAGNOSIS — Z148 Genetic carrier of other disease: Secondary | ICD-10-CM

## 2021-01-28 DIAGNOSIS — O43193 Other malformation of placenta, third trimester: Secondary | ICD-10-CM

## 2021-02-04 ENCOUNTER — Ambulatory Visit: Payer: Medicaid Other | Admitting: *Deleted

## 2021-02-04 ENCOUNTER — Encounter: Payer: Self-pay | Admitting: *Deleted

## 2021-02-04 ENCOUNTER — Other Ambulatory Visit: Payer: Self-pay | Admitting: Obstetrics and Gynecology

## 2021-02-04 ENCOUNTER — Ambulatory Visit: Payer: Medicaid Other | Attending: Obstetrics

## 2021-02-04 ENCOUNTER — Other Ambulatory Visit: Payer: Self-pay

## 2021-02-04 DIAGNOSIS — D563 Thalassemia minor: Secondary | ICD-10-CM | POA: Insufficient documentation

## 2021-02-04 DIAGNOSIS — O43193 Other malformation of placenta, third trimester: Secondary | ICD-10-CM | POA: Diagnosis present

## 2021-02-04 DIAGNOSIS — Z362 Encounter for other antenatal screening follow-up: Secondary | ICD-10-CM

## 2021-02-04 DIAGNOSIS — O43199 Other malformation of placenta, unspecified trimester: Secondary | ICD-10-CM | POA: Insufficient documentation

## 2021-02-04 DIAGNOSIS — Z3480 Encounter for supervision of other normal pregnancy, unspecified trimester: Secondary | ICD-10-CM

## 2021-02-11 ENCOUNTER — Other Ambulatory Visit
Admission: RE | Admit: 2021-02-11 | Discharge: 2021-02-11 | Disposition: A | Discharge status: Home / Self Care | Payer: Medicaid Other | Source: Ambulatory Visit

## 2021-02-11 ENCOUNTER — Ambulatory Visit: Payer: Medicaid Other

## 2021-02-11 ENCOUNTER — Ambulatory Visit (INDEPENDENT_AMBULATORY_CARE_PROVIDER_SITE_OTHER): Payer: Medicaid Other

## 2021-02-11 ENCOUNTER — Other Ambulatory Visit: Payer: Self-pay

## 2021-02-11 VITALS — BP 129/88 | HR 106 | Temp 98.4°F | Wt 308.4 lb

## 2021-02-11 DIAGNOSIS — O26899 Other specified pregnancy related conditions, unspecified trimester: Secondary | ICD-10-CM | POA: Insufficient documentation

## 2021-02-11 DIAGNOSIS — Z3A36 36 weeks gestation of pregnancy: Secondary | ICD-10-CM

## 2021-02-11 DIAGNOSIS — Z3480 Encounter for supervision of other normal pregnancy, unspecified trimester: Secondary | ICD-10-CM | POA: Diagnosis present

## 2021-02-11 DIAGNOSIS — B009 Herpesviral infection, unspecified: Secondary | ICD-10-CM

## 2021-02-11 DIAGNOSIS — N898 Other specified noninflammatory disorders of vagina: Secondary | ICD-10-CM | POA: Insufficient documentation

## 2021-02-11 DIAGNOSIS — O98519 Other viral diseases complicating pregnancy, unspecified trimester: Secondary | ICD-10-CM

## 2021-02-11 MED ORDER — TERCONAZOLE 0.8 % VA CREA
1.0000 | TOPICAL_CREAM | Freq: Every day | VAGINAL | 0 refills | Status: DC
Start: 2021-02-11 — End: 2021-03-02

## 2021-02-11 NOTE — Progress Notes (Signed)
LOW-RISK PREGNANCY OFFICE VISIT  Patient name: Jessica Stuart MRN [id number]  Date of birth: [birth year] Chief Complaint:   Routine Prenatal Visit  Subjective:   Jessica Stuart is a 25 y.o. G93P0010 female at [redacted]w[redacted]d with an Estimated Date of Delivery: 03/07/21 being seen today for ongoing management of a low-risk pregnancy aeb has Herpes genitalis in women; Supervision of other normal pregnancy, antepartum; Herpes simplex type 2 (HSV-2) infection affecting pregnancy, antepartum; Obesity in pregnancy, antepartum; Alpha thalassemia silent carrier; and Marginal insertion of umbilical cord affecting management of mother on their problem list.  Patient presents today with vaginal discharge.  She states she has noted a change in her normal discharge that is more "clumpy."  She denies irritation or problems with urination.  Patient endorses fetal movement. Patient denies abdominal cramping or contractions.  Patient denies other vaginal concerns including leaking of fluid and bleeding.  Contractions: Irregular. Vag. Bleeding: None.  Movement: Present.  Reviewed past medical,surgical, social, obstetrical and family history as well as problem list, medications and allergies.  Objective   Vitals:   02/11/21 1041  BP: 129/88  Pulse: (!) 106  Temp: 98.4 F (36.9 C)  Weight: (!) 308 lb 6.4 oz (139.9 kg)  Body mass index is 45.54 kg/m.  Total Weight Gain:-11 lb 9.6 oz (-5.262 kg)         Physical Examination:   General appearance: Well appearing, and in no distress  Mental status: Alert, oriented to person, place, and time  Skin: Warm & dry  Cardiovascular: Normal heart rate noted  Respiratory: Normal respiratory effort, no distress  Abdomen: Soft, gravid, nontender, AGA with Fundal Height: 38 cm  Pelvic: Cervical exam performed  Dilation: Closed Effacement (%): Thick Station: Ballotable Presentation: Undeterminable  Extremities: Edema: None  Fetal Status: Fetal Heart Rate (bpm): 143   Movement: Present   No results found for this or any previous visit (from the past 24 hour(s)).  Assessment & Plan:  Low-risk pregnancy of a 25 y.o., G2P0010 at [redacted]w[redacted]d with an Estimated Date of Delivery: 03/07/21   1. Supervision of other normal pregnancy, antepartum -Anticipatory guidance for upcoming appts. -Patient to schedule next appt in 1 weeks for an in-person visit.  2. [redacted] weeks gestation of pregnancy -Labs as below. -Educated on GBS bacteria including what it is, why we test, and how and when we treat if needed. -Discussed releasing of results to mychart. -Discussed and reviewed postpartum planning including contraception, pediatricians, and infant feedings and circumcision. *Patient desires to breastfeed and is not considering postpartum contraception currently.  3. Vaginal discharge during pregnancy, antepartum -CV today. -Exam definitive for yeast. -Rx for Terazol 3 sent to pharmacy on file.    4. Herpes simplex type 2 (HSV-2) infection affecting pregnancy, antepartum -Picked up prescription today. -Instructed to start now.      Meds:  Meds ordered this encounter  Medications  . terconazole (TERAZOL 3) 0.8 % vaginal cream    Sig: Place 1 applicator vaginally at bedtime.    Dispense:  20 g    Refill:  0    Order Specific Question:   Supervising Provider    Answer:   Reva Bores [[id number]]   Labs/procedures today:  Lab Orders     Culture, beta strep (group b only)   Reviewed: Term labor symptoms and general obstetric precautions including but not limited to vaginal bleeding, contractions, leaking of fluid and fetal movement were reviewed in detail with the patient.  All questions were answered.  Follow-up:  No follow-ups on file.  Orders Placed This Encounter  Procedures  . Culture, beta strep (group b only)   Cherre Robins MSN, CNM 02/11/2021

## 2021-02-11 NOTE — Patient Instructions (Signed)

## 2021-02-12 LAB — CERVICOVAGINAL ANCILLARY ONLY
Bacterial Vaginitis (gardnerella): POSITIVE — AB
Candida Glabrata: NEGATIVE
Candida Vaginitis: POSITIVE — AB
Chlamydia: NEGATIVE
Comment: NEGATIVE
Comment: NEGATIVE
Comment: NEGATIVE
Comment: NEGATIVE
Comment: NEGATIVE
Comment: NORMAL
Neisseria Gonorrhea: NEGATIVE
Trichomonas: NEGATIVE

## 2021-02-14 LAB — CULTURE, BETA STREP (GROUP B ONLY): Strep Gp B Culture: POSITIVE — AB

## 2021-02-16 ENCOUNTER — Other Ambulatory Visit: Payer: Medicaid Other

## 2021-02-16 ENCOUNTER — Other Ambulatory Visit: Payer: Self-pay

## 2021-02-16 ENCOUNTER — Ambulatory Visit: Payer: Medicaid Other | Admitting: *Deleted

## 2021-02-16 ENCOUNTER — Ambulatory Visit (INDEPENDENT_AMBULATORY_CARE_PROVIDER_SITE_OTHER): Payer: Medicaid Other

## 2021-02-16 VITALS — BP 129/64 | HR 94 | Wt 305.1 lb

## 2021-02-16 DIAGNOSIS — O99210 Obesity complicating pregnancy, unspecified trimester: Secondary | ICD-10-CM

## 2021-02-16 NOTE — Progress Notes (Signed)

## 2021-02-18 ENCOUNTER — Other Ambulatory Visit: Payer: Medicaid Other

## 2021-02-18 ENCOUNTER — Ambulatory Visit: Payer: Medicaid Other

## 2021-02-19 DIAGNOSIS — B951 Streptococcus, group B, as the cause of diseases classified elsewhere: Secondary | ICD-10-CM | POA: Insufficient documentation

## 2021-02-26 ENCOUNTER — Encounter (HOSPITAL_COMMUNITY): Payer: Self-pay | Admitting: Obstetrics & Gynecology

## 2021-02-26 ENCOUNTER — Ambulatory Visit (INDEPENDENT_AMBULATORY_CARE_PROVIDER_SITE_OTHER): Payer: Medicaid Other | Admitting: Obstetrics and Gynecology

## 2021-02-26 ENCOUNTER — Inpatient Hospital Stay (HOSPITAL_COMMUNITY)
Admission: AD | Admit: 2021-02-26 | Discharge: 2021-03-02 | DRG: 806 | Disposition: A | Discharge status: Home / Self Care | Payer: Medicaid Other | Attending: Obstetrics & Gynecology | Admitting: Obstetrics & Gynecology

## 2021-02-26 ENCOUNTER — Ambulatory Visit: Payer: Medicaid Other | Admitting: *Deleted

## 2021-02-26 ENCOUNTER — Other Ambulatory Visit: Payer: Self-pay

## 2021-02-26 ENCOUNTER — Ambulatory Visit (HOSPITAL_BASED_OUTPATIENT_CLINIC_OR_DEPARTMENT_OTHER): Payer: Medicaid Other

## 2021-02-26 ENCOUNTER — Encounter: Payer: Self-pay | Admitting: *Deleted

## 2021-02-26 VITALS — BP 149/70 | HR 90

## 2021-02-26 VITALS — BP 137/81 | HR 87 | Wt 313.0 lb

## 2021-02-26 DIAGNOSIS — O43199 Other malformation of placenta, unspecified trimester: Secondary | ICD-10-CM

## 2021-02-26 DIAGNOSIS — O99824 Streptococcus B carrier state complicating childbirth: Secondary | ICD-10-CM | POA: Diagnosis present

## 2021-02-26 DIAGNOSIS — O43123 Velamentous insertion of umbilical cord, third trimester: Secondary | ICD-10-CM | POA: Diagnosis present

## 2021-02-26 DIAGNOSIS — B009 Herpesviral infection, unspecified: Secondary | ICD-10-CM | POA: Diagnosis present

## 2021-02-26 DIAGNOSIS — Z3480 Encounter for supervision of other normal pregnancy, unspecified trimester: Secondary | ICD-10-CM | POA: Insufficient documentation

## 2021-02-26 DIAGNOSIS — O43193 Other malformation of placenta, third trimester: Secondary | ICD-10-CM

## 2021-02-26 DIAGNOSIS — O99210 Obesity complicating pregnancy, unspecified trimester: Secondary | ICD-10-CM | POA: Diagnosis present

## 2021-02-26 DIAGNOSIS — A6000 Herpesviral infection of urogenital system, unspecified: Secondary | ICD-10-CM | POA: Diagnosis present

## 2021-02-26 DIAGNOSIS — D563 Thalassemia minor: Secondary | ICD-10-CM | POA: Diagnosis present

## 2021-02-26 DIAGNOSIS — O1414 Severe pre-eclampsia complicating childbirth: Principal | ICD-10-CM | POA: Diagnosis present

## 2021-02-26 DIAGNOSIS — O98519 Other viral diseases complicating pregnancy, unspecified trimester: Secondary | ICD-10-CM

## 2021-02-26 DIAGNOSIS — O1413 Severe pre-eclampsia, third trimester: Secondary | ICD-10-CM | POA: Diagnosis present

## 2021-02-26 DIAGNOSIS — O99214 Obesity complicating childbirth: Secondary | ICD-10-CM | POA: Diagnosis present

## 2021-02-26 DIAGNOSIS — O9832 Other infections with a predominantly sexual mode of transmission complicating childbirth: Secondary | ICD-10-CM | POA: Diagnosis present

## 2021-02-26 DIAGNOSIS — Z3A38 38 weeks gestation of pregnancy: Secondary | ICD-10-CM

## 2021-02-26 DIAGNOSIS — Z362 Encounter for other antenatal screening follow-up: Secondary | ICD-10-CM

## 2021-02-26 DIAGNOSIS — O99213 Obesity complicating pregnancy, third trimester: Secondary | ICD-10-CM | POA: Diagnosis not present

## 2021-02-26 DIAGNOSIS — A6009 Herpesviral infection of other urogenital tract: Secondary | ICD-10-CM | POA: Diagnosis present

## 2021-02-26 DIAGNOSIS — Z20822 Contact with and (suspected) exposure to covid-19: Secondary | ICD-10-CM | POA: Diagnosis present

## 2021-02-26 DIAGNOSIS — B951 Streptococcus, group B, as the cause of diseases classified elsewhere: Secondary | ICD-10-CM | POA: Diagnosis present

## 2021-02-26 DIAGNOSIS — Z148 Genetic carrier of other disease: Secondary | ICD-10-CM

## 2021-02-26 LAB — URINALYSIS, ROUTINE W REFLEX MICROSCOPIC
Bilirubin Urine: NEGATIVE
Glucose, UA: NEGATIVE mg/dL
Ketones, ur: NEGATIVE mg/dL
Nitrite: NEGATIVE
Protein, ur: NEGATIVE mg/dL
Specific Gravity, Urine: 1.023 (ref 1.005–1.030)
pH: 6 (ref 5.0–8.0)

## 2021-02-26 MED ORDER — VALACYCLOVIR HCL 500 MG PO TABS: 500.0000 mg | ORAL_TABLET | Freq: Two times a day (BID) | ORAL | 1 refills | Status: DC

## 2021-02-26 NOTE — Progress Notes (Signed)
   LOW-RISK PREGNANCY OFFICE VISIT Patient name: Markie Heffernan MRN [id number]  Date of birth: 09/29/1995 Chief Complaint:   Routine Prenatal Visit  History of Present Illness:   Itsel Opfer is a 25 y.o. G75P0010 female at [redacted]w[redacted]d with an Estimated Date of Delivery: 03/07/21 being seen today for ongoing management of a low-risk pregnancy.  Today she reports no complaints. Contractions: Irregular. Vag. Bleeding: None.  Movement: Present. denies leaking of fluid. Review of Systems:   Pertinent items are noted in HPI Denies abnormal vaginal discharge w/ itching/odor/irritation, headaches, visual changes, shortness of breath, chest pain, abdominal pain, severe nausea/vomiting, or problems with urination or bowel movements unless otherwise stated above. Pertinent History Reviewed:  Reviewed past medical,surgical, social, obstetrical and family history.  Reviewed problem list, medications and allergies. Physical Assessment:   Vitals:   02/26/21 0918  BP: 137/81  Pulse: 87  Weight: (!) 313 lb (142 kg)  Body mass index is 46.22 kg/m.        Physical Examination:   General appearance: Well appearing, and in no distress  Mental status: Alert, oriented to person, place, and time  Skin: Warm & dry  Cardiovascular: Normal heart rate noted  Respiratory: Normal respiratory effort, no distress  Abdomen: Soft, gravid, nontender  Pelvic: Cervical exam deferred per pt request        Extremities: Edema: None  Fetal Status: Fetal Heart Rate (bpm): 141 Fundal Height: 46 cm Movement: Present Presentation: Undeterminable  No results found for this or any previous visit (from the past 24 hour(s)).  Assessment & Plan:  1) Low-risk pregnancy G2P0010 at [redacted]w[redacted]d with an Estimated Date of Delivery: 03/07/21   2) Supervision of other normal pregnancy, antepartum - RTO next week  3) Obesity in pregnancy, antepartum - Taking bASA daily - U/S today - Supposed to discuss MF recommendation for IOL - Does  not desire IOL - Informed that if she declines IOL @ 39 wks, strongly recommend IOL at 40 wks  4) Herpes simplex type 2 (HSV-2) infection affecting pregnancy, antepartum  - Rx refill sent for valACYclovir (VALTREX) 500 MG tablet  5) [redacted] weeks gestation of pregnancy    Meds:  Meds ordered this encounter  Medications   valACYclovir (VALTREX) 500 MG tablet    Sig: Take 1 tablet (500 mg total) by mouth 2 (two) times daily.    Dispense:  60 tablet    Refill:  1    Order Specific Question:   Supervising Provider    Answer:   Reva Bores [[id number]]   Labs/procedures today: none  Plan:  Continue routine obstetrical care   Reviewed: Term labor symptoms and general obstetric precautions including but not limited to vaginal bleeding, contractions, leaking of fluid and fetal movement were reviewed in detail with the patient.  All questions were answered. Has home bp cuff.  Check bp weekly, let us know if >140/90.   Follow-up: No follow-ups on file.  No orders of the defined types were placed in this encounter.  Raelyn Mora MSN, CNM 02/26/2021 9:28 AM

## 2021-02-26 NOTE — MAU Note (Signed)
I had my u/s earlier today and my b/p was high. At home it was 157/74. I have a headache and some pressure in lower abd. Denies VB or LOF.

## 2021-02-27 DIAGNOSIS — O99820 Streptococcus B carrier state complicating pregnancy: Secondary | ICD-10-CM | POA: Diagnosis not present

## 2021-02-27 DIAGNOSIS — Z3A38 38 weeks gestation of pregnancy: Secondary | ICD-10-CM | POA: Diagnosis not present

## 2021-02-27 DIAGNOSIS — A6000 Herpesviral infection of urogenital system, unspecified: Secondary | ICD-10-CM | POA: Diagnosis present

## 2021-02-27 DIAGNOSIS — O134 Gestational [pregnancy-induced] hypertension without significant proteinuria, complicating childbirth: Secondary | ICD-10-CM | POA: Diagnosis not present

## 2021-02-27 DIAGNOSIS — O43123 Velamentous insertion of umbilical cord, third trimester: Secondary | ICD-10-CM | POA: Diagnosis present

## 2021-02-27 DIAGNOSIS — Z20822 Contact with and (suspected) exposure to covid-19: Secondary | ICD-10-CM | POA: Diagnosis present

## 2021-02-27 DIAGNOSIS — O1414 Severe pre-eclampsia complicating childbirth: Secondary | ICD-10-CM | POA: Diagnosis present

## 2021-02-27 DIAGNOSIS — O9832 Other infections with a predominantly sexual mode of transmission complicating childbirth: Secondary | ICD-10-CM | POA: Diagnosis present

## 2021-02-27 DIAGNOSIS — O99214 Obesity complicating childbirth: Secondary | ICD-10-CM | POA: Diagnosis present

## 2021-02-27 DIAGNOSIS — D563 Thalassemia minor: Secondary | ICD-10-CM | POA: Diagnosis present

## 2021-02-27 DIAGNOSIS — O99824 Streptococcus B carrier state complicating childbirth: Secondary | ICD-10-CM | POA: Diagnosis present

## 2021-02-27 DIAGNOSIS — O1413 Severe pre-eclampsia, third trimester: Secondary | ICD-10-CM | POA: Diagnosis present

## 2021-02-27 DIAGNOSIS — R030 Elevated blood-pressure reading, without diagnosis of hypertension: Secondary | ICD-10-CM | POA: Diagnosis present

## 2021-02-27 LAB — COMPREHENSIVE METABOLIC PANEL
ALT: 15 U/L (ref 0–44)
AST: 17 U/L (ref 15–41)
Albumin: 2.5 g/dL — ABNORMAL LOW (ref 3.5–5.0)
Alkaline Phosphatase: 101 U/L (ref 38–126)
Anion gap: 11 (ref 5–15)
BUN: 8 mg/dL (ref 6–20)
CO2: 21 mmol/L — ABNORMAL LOW (ref 22–32)
Calcium: 8.7 mg/dL — ABNORMAL LOW (ref 8.9–10.3)
Chloride: 103 mmol/L (ref 98–111)
Creatinine, Ser: 0.66 mg/dL (ref 0.44–1.00)
GFR, Estimated: >60 mL/min (ref >60)
Glucose, Bld: 78 mg/dL (ref 70–99)
Potassium: 3.7 mmol/L (ref 3.5–5.1)
Sodium: 135 mmol/L (ref 135–145)
Total Bilirubin: 0.2 mg/dL — ABNORMAL LOW (ref 0.3–1.2)
Total Protein: 6.4 g/dL — ABNORMAL LOW (ref 6.5–8.1)

## 2021-02-27 LAB — RPR: RPR Ser Ql: NONREACTIVE

## 2021-02-27 LAB — TYPE AND SCREEN
ABO/RH(D): A POS
Antibody Screen: NEGATIVE

## 2021-02-27 LAB — CBC
HCT: 35.1 % — ABNORMAL LOW (ref 36.0–46.0)
Hemoglobin: 11.1 g/dL — ABNORMAL LOW (ref 12.0–15.0)
MCH: 25.8 pg — ABNORMAL LOW (ref 26.0–34.0)
MCHC: 31.6 g/dL (ref 30.0–36.0)
MCV: 81.6 fL (ref 80.0–100.0)
Platelets: 239 10*3/uL (ref 150–400)
RBC: 4.3 MIL/uL (ref 3.87–5.11)
RDW: 15.5 % (ref 11.5–15.5)
WBC: 12.5 10*3/uL — ABNORMAL HIGH (ref 4.0–10.5)
nRBC: 0 % (ref 0.0–0.2)

## 2021-02-27 LAB — RESP PANEL BY RT-PCR (FLU A&B, COVID) ARPGX2
Influenza A by PCR: NEGATIVE
Influenza B by PCR: NEGATIVE
SARS Coronavirus 2 by RT PCR: NEGATIVE

## 2021-02-27 LAB — BRAIN NATRIURETIC PEPTIDE: B Natriuretic Peptide: 18 pg/mL (ref 0.0–100.0)

## 2021-02-27 LAB — PROTEIN / CREATININE RATIO, URINE
Creatinine, Urine: 207.56 mg/dL
Protein Creatinine Ratio: 0.08 mg/mg{Cre} (ref 0.00–0.15)
Total Protein, Urine: 16 mg/dL

## 2021-02-27 LAB — TROPONIN I (HIGH SENSITIVITY): Troponin I (High Sensitivity): 5 ng/L (ref <18)

## 2021-02-27 MED ORDER — VANCOMYCIN HCL IN DEXTROSE 1-5 GM/200ML-% IV SOLN
1000.0000 mg | Freq: Two times a day (BID) | INTRAVENOUS | Status: DC
  Administered 2021-02-27 – 2021-02-28 (×4): 1000 mg via INTRAVENOUS
  Filled 2021-02-27 (×5): qty 200

## 2021-02-27 MED ORDER — VANCOMYCIN HCL 10 G IV SOLR: 2000.0000 mg | Freq: Once | INTRAVENOUS | Status: DC

## 2021-02-27 MED ORDER — SOD CITRATE-CITRIC ACID 490-640 MG/5ML PO SOLN
30.0000 mL | ORAL | Status: DC | PRN
  Filled 2021-02-27: qty 30

## 2021-02-27 MED ORDER — TERBUTALINE SULFATE 1 MG/ML IJ SOLN: 0.2500 mg | Freq: Once | INTRAMUSCULAR | Status: DC | PRN

## 2021-02-27 MED ORDER — LABETALOL HCL 5 MG/ML IV SOLN
20.0000 mg | INTRAVENOUS | Status: DC | PRN
  Administered 2021-02-28: 20 mg via INTRAVENOUS

## 2021-02-27 MED ORDER — LABETALOL HCL 5 MG/ML IV SOLN
40.0000 mg | INTRAVENOUS | Status: DC | PRN
  Administered 2021-02-28: 40 mg via INTRAVENOUS
  Filled 2021-02-27: qty 8

## 2021-02-27 MED ORDER — MAGNESIUM SULFATE BOLUS VIA INFUSION
4.0000 g | Freq: Once | INTRAVENOUS | Status: AC
  Administered 2021-02-27: 4 g via INTRAVENOUS
  Filled 2021-02-27: qty 1000

## 2021-02-27 MED ORDER — ACETAMINOPHEN 325 MG PO TABS
650.0000 mg | ORAL_TABLET | ORAL | Status: DC | PRN
  Administered 2021-02-28: 650 mg via ORAL
  Filled 2021-02-27: qty 2

## 2021-02-27 MED ORDER — MISOPROSTOL 50MCG HALF TABLET
50.0000 ug | ORAL_TABLET | ORAL | Status: DC
  Administered 2021-02-27 – 2021-02-28 (×5): 50 ug via ORAL
  Filled 2021-02-27 (×5): qty 1

## 2021-02-27 MED ORDER — HYDRALAZINE HCL 20 MG/ML IJ SOLN: 10.0000 mg | INTRAMUSCULAR | Status: DC | PRN

## 2021-02-27 MED ORDER — ACETAMINOPHEN 500 MG PO TABS
1000.0000 mg | ORAL_TABLET | Freq: Once | ORAL | Status: AC
  Administered 2021-02-27: 1000 mg via ORAL
  Filled 2021-02-27: qty 2

## 2021-02-27 MED ORDER — FENTANYL CITRATE (PF) 100 MCG/2ML IJ SOLN
100.0000 ug | INTRAMUSCULAR | Status: DC | PRN
  Administered 2021-02-27 – 2021-02-28 (×6): 100 ug via INTRAVENOUS
  Filled 2021-02-27 (×5): qty 2

## 2021-02-27 MED ORDER — LABETALOL HCL 5 MG/ML IV SOLN
80.0000 mg | INTRAVENOUS | Status: DC | PRN
  Administered 2021-02-28: 80 mg via INTRAVENOUS
  Filled 2021-02-27 (×2): qty 16

## 2021-02-27 MED ORDER — OXYCODONE-ACETAMINOPHEN 5-325 MG PO TABS: 2.0000 | ORAL_TABLET | ORAL | Status: DC | PRN

## 2021-02-27 MED ORDER — OXYCODONE-ACETAMINOPHEN 5-325 MG PO TABS: 1.0000 | ORAL_TABLET | ORAL | Status: DC | PRN

## 2021-02-27 MED ORDER — ONDANSETRON HCL 4 MG/2ML IJ SOLN: 4.0000 mg | Freq: Four times a day (QID) | INTRAMUSCULAR | Status: DC | PRN

## 2021-02-27 MED ORDER — LIDOCAINE HCL (PF) 1 % IJ SOLN
30.0000 mL | INTRAMUSCULAR | Status: AC | PRN
  Administered 2021-02-28: 30 mL via SUBCUTANEOUS
  Filled 2021-02-27: qty 30

## 2021-02-27 MED ORDER — OXYTOCIN-SODIUM CHLORIDE 30-0.9 UT/500ML-% IV SOLN: 2.5000 [IU]/h | INTRAVENOUS | Status: DC

## 2021-02-27 MED ORDER — OXYTOCIN BOLUS FROM INFUSION
333.0000 mL | Freq: Once | INTRAVENOUS | Status: AC
  Administered 2021-02-28: 333 mL via INTRAVENOUS

## 2021-02-27 MED ORDER — LACTATED RINGERS IV SOLN: INTRAVENOUS | Status: DC

## 2021-02-27 MED ORDER — LACTATED RINGERS IV SOLN: 500.0000 mL | INTRAVENOUS | Status: DC | PRN

## 2021-02-27 MED ORDER — MAGNESIUM SULFATE 40 GM/1000ML IV SOLN
2.0000 g/h | INTRAVENOUS | Status: AC
  Administered 2021-02-27: 2 g/h via INTRAVENOUS
  Filled 2021-02-27 (×2): qty 1000

## 2021-02-27 MED ORDER — FENTANYL CITRATE (PF) 100 MCG/2ML IJ SOLN
INTRAMUSCULAR | Status: AC
  Filled 2021-02-27: qty 2

## 2021-02-27 NOTE — Progress Notes (Signed)
LABOR PROGRESS NOTE  Leniyah Martell is a 25 y.o. G2P0010 at [redacted]w[redacted]d admitted for IOL d/t PreE with severe features.  Subjective: Doing well without complaints.  Objective: BP (!) 118/49   Pulse 75   Temp 98 F (36.7 C) (Oral)   Resp 16   Ht 5\' 9"  (1.753 m)   Wt (!) 142.9 kg   LMP 06/08/2020   SpO2 99%   BMI 46.52 kg/m    Dilation: 1 Effacement (%): 50 Cervical Position: Posterior Station: -3 Presentation: Vertex Exam by:: Dr. [email or web address] Fetal monitoring: Baseline: 150 bpm, Variability: Good {> 6 bpm), Accelerations: Reactive, and Decelerations: Absent Uterine activity: q 1-4 minutes  Labs: Lab Results  Component Value Date   WBC 12.5 (H) 02/27/2021   HGB 11.1 (L) 02/27/2021   HCT 35.1 (L) 02/27/2021   MCV 81.6 02/27/2021   PLT 239 02/27/2021    Patient Active Problem List   Diagnosis Date Noted   Preeclampsia, severe, third trimester 02/27/2021   Positive GBS test 02/19/2021   Alpha thalassemia silent carrier 01/02/2021   Marginal insertion of umbilical cord affecting management of mother 01/02/2021   Supervision of other normal pregnancy, antepartum 09/25/2020   Herpes simplex type 2 (HSV-2) infection affecting pregnancy, antepartum 09/25/2020   Obesity in pregnancy, antepartum 09/25/2020   Herpes genitalis in women 01/04/2017    Assessment / Plan: IOL d/t severe PreE (intractable headache and severe Bps)  #IOL: S/p cytotec x2. Cook's catheter risks/benefits discussed and placed without difficulty, inflated to 27mL/80mL. Given patient is contracting q1 min will not re-dose cytotec at this time. Consider re-dosing vs starting pitocin when contractions space out. #Severe PreE: On magnesium. PreE labs wnl. Asymptomatic. BP 118/49 most recently. #Marginal cord: will send placenta to path #Hx HSV: Last outbreak 88yr ago. Had been on suppression this pregnancy. No current lesions on external exam.  #Fetal Wellbeing:  Category I #Pain Control: per pt request #ID:  GBS positive > Vanc  3yr, MD OB Fellow, Faculty [facility], Center for [facility] 02/27/2021 4:03 PM

## 2021-02-27 NOTE — Progress Notes (Signed)
Patient ID: Jessica Stuart, female   DOB: Mar 29, 1996, 25 y.o.   MRN: [id number] Doing better EKG is normal sinus rhythm  Results for orders placed or performed during the hospital encounter of 02/26/21 (from the past 24 hour(s))  Urinalysis, Routine w reflex microscopic Urine, Clean Catch     Status: Abnormal   Collection Time: 02/26/21 10:49 PM  Result Value Ref Range   Color, Urine YELLOW YELLOW   APPearance HAZY (A) CLEAR   Specific Gravity, Urine 1.023 1.005 - 1.030   pH 6.0 5.0 - 8.0   Glucose, UA NEGATIVE NEGATIVE mg/dL   Hgb urine dipstick SMALL (A) NEGATIVE   Bilirubin Urine NEGATIVE NEGATIVE   Ketones, ur NEGATIVE NEGATIVE mg/dL   Protein, ur NEGATIVE NEGATIVE mg/dL   Nitrite NEGATIVE NEGATIVE   Leukocytes,Ua LARGE (A) NEGATIVE   RBC / HPF 0-5 0 - 5 RBC/hpf   WBC, UA 6-10 0 - 5 WBC/hpf   Bacteria, UA RARE (A) NONE SEEN   Squamous Epithelial / LPF 0-5 0 - 5   Mucus PRESENT   Protein / creatinine ratio, urine     Status: None   Collection Time: 02/26/21 10:49 PM  Result Value Ref Range   Creatinine, Urine 207.56 mg/dL   Total Protein, Urine 16 mg/dL   Protein Creatinine Ratio 0.08 0.00 - 0.15 mg/mg[Cre]  Resp Panel by RT-PCR (Flu A&B, Covid) Nasopharyngeal Swab     Status: None   Collection Time: 02/27/21  1:05 AM   Specimen: Nasopharyngeal Swab; Nasopharyngeal(NP) swabs in vial transport medium  Result Value Ref Range   SARS Coronavirus 2 by RT PCR NEGATIVE NEGATIVE   Influenza A by PCR NEGATIVE NEGATIVE   Influenza B by PCR NEGATIVE NEGATIVE  CBC     Status: Abnormal   Collection Time: 02/27/21  1:16 AM  Result Value Ref Range   WBC 12.5 (H) 4.0 - 10.5 K/uL   RBC 4.30 3.87 - 5.11 MIL/uL   Hemoglobin 11.1 (L) 12.0 - 15.0 g/dL   HCT 56.2 (L) 13.0 - 86.5 %   MCV 81.6 80.0 - 100.0 fL   MCH 25.8 (L) 26.0 - 34.0 pg   MCHC 31.6 30.0 - 36.0 g/dL   RDW 78.4 69.6 - 29.5 %   Platelets 239 150 - 400 K/uL   nRBC 0.0 0.0 - 0.2 %  Comprehensive metabolic panel     Status:  Abnormal   Collection Time: 02/27/21  1:16 AM  Result Value Ref Range   Sodium 135 135 - 145 mmol/L   Potassium 3.7 3.5 - 5.1 mmol/L   Chloride 103 98 - 111 mmol/L   CO2 21 (L) 22 - 32 mmol/L   Glucose, Bld 78 70 - 99 mg/dL   BUN 8 6 - 20 mg/dL   Creatinine, Ser 2.84 0.44 - 1.00 mg/dL   Calcium 8.7 (L) 8.9 - 10.3 mg/dL   Total Protein 6.4 (L) 6.5 - 8.1 g/dL   Albumin 2.5 (L) 3.5 - 5.0 g/dL   AST 17 15 - 41 U/L   ALT 15 0 - 44 U/L   Alkaline Phosphatase 101 38 - 126 U/L   Total Bilirubin 0.2 (L) 0.3 - 1.2 mg/dL   GFR, Estimated >13 >24 mL/min   Anion gap 11 5 - 15  Type and screen [facility]     Status: None   Collection Time: 02/27/21  1:17 AM  Result Value Ref Range   ABO/RH(D) A POS    Antibody Screen NEG  Sample Expiration      03/02/2021,2359 Performed at [facility], [address], [place], [place] [address]   Troponin I (High Sensitivity)     Status: None   Collection Time: 02/27/21  5:11 AM  Result Value Ref Range   Troponin I (High Sensitivity) 5 <18 ng/L  Brain natriuretic peptide     Status: None   Collection Time: 02/27/21  5:11 AM  Result Value Ref Range   B Natriuretic Peptide 18.0 0.0 - 100.0 pg/mL   WIll continue to observe.

## 2021-02-27 NOTE — Progress Notes (Signed)
Patient ID: Jessica Stuart, female   DOB: 11-May-1996, 25 y.o.   MRN: [id number] Arrived on Labor and Delivery for IOL  MGSO4 infusing  Patient c/o chest pressure. Substernal, not painful Oxygen sats WNL Vitals:   02/27/21 0401 02/27/21 0416 02/27/21 0430 02/27/21 0431  BP: (!) 140/59 (!) 141/63  (!) 147/79  Pulse: 81 89  83  Resp:    16  Temp:   98.2 F (36.8 C)   TempSrc:   Oral   SpO2:      Weight:      Height:       Consulted Dr Charlotta Newton, agrees with plan for EKG, Troponin x 1, BNP  Will go ahead and start Cytotec Suspect this may be sensitivity to MgSO4 or anxiety.  Wants to wait for cousin prior to getting Foley inserted

## 2021-02-27 NOTE — Progress Notes (Signed)
LABOR PROGRESS NOTE  Jessica Stuart is a 25 y.o. G2P0010 at [redacted]w[redacted]d admitted for IOL d/t PreE with severe features.  Subjective: No longer complaining of a headache. Not feeling any contractions yet.  Objective: BP (!) 156/95   Pulse 79   Temp 97.9 F (36.6 C) (Oral)   Resp 16   Ht 5\' 9"  (1.753 m)   Wt (!) 142.9 kg   LMP 06/08/2020   SpO2 99%   BMI 46.52 kg/m    Dilation: 1 Effacement (%): 50 Cervical Position: Posterior Station: Ballotable Presentation: Undeterminable Exam by:: Dr. [email or web address] Fetal monitoring: Baseline: 125 bpm, Variability: Good {> 6 bpm), Accelerations: Reactive, and Decelerations: Absent Uterine activity: q 3-5 minutes, mild  Labs: Lab Results  Component Value Date   WBC 12.5 (H) 02/27/2021   HGB 11.1 (L) 02/27/2021   HCT 35.1 (L) 02/27/2021   MCV 81.6 02/27/2021   PLT 239 02/27/2021    Patient Active Problem List   Diagnosis Date Noted   Preeclampsia, severe, third trimester 02/27/2021   Positive GBS test 02/19/2021   Alpha thalassemia silent carrier 01/02/2021   Marginal insertion of umbilical cord affecting management of mother 01/02/2021   Supervision of other normal pregnancy, antepartum 09/25/2020   Herpes simplex type 2 (HSV-2) infection affecting pregnancy, antepartum 09/25/2020   Obesity in pregnancy, antepartum 09/25/2020   Herpes genitalis in women 01/04/2017    Assessment / Plan: IOL d/t severe PreE (intractable headache and severe Bps)  #IOL: S/p cytotec at 0714. Attempted FB placement, but pt's internal os is still closed and baby's head is very high, so placement was unsuccessful. Will check again in 4 hrs, can give 2nd dose of cytotec 4hrs after first dose given. #Severe PreE: On magnesium. Most recent BP in 150s/80-90s. Continue to monitor. Headache has improved, otherwise asymptomatic. PreE labs wnl. #Marginal cord: will send placenta to path #Hx HSV: Last outbreak 60yr ago. Had been on suppression this pregnancy. No  current lesions.  #Fetal Wellbeing:  Category I #Pain Control: per pt request #ID: GBS positive > Vanc #Anticipated MOD: NSVD   3yr MD, PGY-1 Family Medicine Resident, [facility] Faculty Teaching Service  02/27/2021, 8:58 AM

## 2021-02-27 NOTE — H&P (Signed)
Jessica Stuart is a 25 y.o. female G2P0010 at [redacted]w[redacted]d presenting for elevated blood pressure and headache. Pregnancy has been complicated by obesity BMI 46 and marginal cord insertion only until 02/26/21 when BP was 140s/90s at her [place] with MFM.   After her [place], she took BP at home and they were consistently elevated over 140 for systolic and she developed a frontal headache.  She has not taken anything for her headache.  She denies epigastric pain or visual disturbances.    Nursing Staff Provider  Office Location  [facility] Dating  LMP  Language  English Anatomy US  EIF LT ventr, marginal cord  Flu Vaccine   Genetic Screen  NIPS:LR Female  AFP:  negative  TDaP Vaccine   Declined Hgb A1C or  GTT Early  Third trimester WNL Glucose, Fasting 65 - 91 mg/dL 74   Glucose, 1 hour 65 - 179 mg/dL 161   Glucose, 2 hour 65 - 152 mg/dL 096     COVID Vaccine Completed   LAB RESULTS   Rhogam  N/A Blood Type A/Positive/-- (01/13 1056)   Feeding Plan Breast Antibody Negative (01/13 1056)  Contraception None Rubella 6.38 (01/13 1056) IMMUNE  Circumcision yes RPR Non Reactive (01/13 1056)   Pediatrician  Info given HBsAg Negative (01/13 1056)   Support Person FOB HCVAb Negative  Prenatal Classes Info given HIV Non Reactive (01/13 1056)     BTL Consent N/A GBS   (For PCN allergy, check sensitivities)   VBAC Consent N/A Pap Normal>>absent transformation zone    Hgb Electro  Silent carrier alpha-thal  BP Cuff Rx [facility] 09/25/20 CF Neg [place]  Edison International Scale Rx [facility] 09/25/20 SMA Neg [facility]    Waterbirth  [ ]  Class [ ]  Consent [ ]  CNM visit    Induction  [ ]  Orders Entered [ ] Foley Y/N    OB History     Gravida  2   Para  0   Term  0   Preterm  0   AB  1   Living         SAB  1   IAB  0   Ectopic  0   Multiple      Live Births             Past Medical History:  Diagnosis Date   Eczema    Past Surgical History:  Procedure Laterality Date   NO PAST SURGERIES      Family History: family history includes Heart disease in her mother. Social History:  reports that she has never smoked. She has never used smokeless tobacco. She reports previous alcohol use. She reports that she does not use drugs.     Maternal Diabetes: No Genetic Screening: Normal Maternal Ultrasounds/Referrals: Other: Fetal Ultrasounds or other Referrals:  Other:  Maternal Substance Abuse:  No Significant Maternal Medications:  None Significant Maternal Lab Results:  Group B Strep positive Other Comments:   marginal cord insertion, normal growth  Review of Systems  Constitutional:  Negative for chills, fatigue and fever.  Eyes:  Negative for visual disturbance.  Respiratory:  Negative for shortness of breath.   Cardiovascular:  Negative for chest pain.  Gastrointestinal:  Negative for abdominal pain, nausea and vomiting.  Genitourinary:  Negative for difficulty urinating, dysuria, flank pain, pelvic pain, vaginal bleeding, vaginal discharge and vaginal pain.  Neurological:  Positive for headaches. Negative for dizziness.  Psychiatric/Behavioral: Negative.    Maternal Medical History:  Reason for  admission: Nausea. HTN, headache    Contractions: Frequency: rare.   Perceived severity is mild.   Fetal activity: Perceived fetal activity is normal.   Last perceived fetal movement was within the past hour.   Prenatal complications: Obesity, marginal cord insertion Prenatal Complications - Diabetes: none.    Blood pressure 139/69, pulse 93, temperature 98.3 F (36.8 C), resp. rate 16, height 5\' 9"  (1.753 m), weight (!) 142.9 kg, last menstrual period 06/08/2020, SpO2 99 %, unknown if currently breastfeeding. Maternal Exam:  Uterine Assessment: Contraction strength is mild.  Contraction frequency is rare.  Abdomen: Estimated fetal weight is 3583  gm    7 lb 14 oz      68  % by 06/10/2020 02/26/21.   Fetal presentation: vertex Cervix: Cervix evaluated by digital exam.      Fetal Exam Fetal Monitor Review: Mode: ultrasound.   Baseline rate: 135.  Variability: moderate (6-25 bpm).   Pattern: accelerations present and no decelerations.   Fetal State Assessment: Category I - tracings are normal.  Physical Exam Vitals and nursing note reviewed.  Constitutional:      Appearance: She is well-developed.  Cardiovascular:     Rate and Rhythm: Normal rate and regular rhythm.     Heart sounds: Normal heart sounds.  Pulmonary:     Effort: Pulmonary effort is normal.  Abdominal:     Palpations: Abdomen is soft.  Genitourinary:    Labia:        Right: No lesion.        Left: No lesion.   Musculoskeletal:        General: Normal range of motion.     Cervical back: Normal range of motion.  Skin:    General: Skin is warm and dry.  Neurological:     Mental Status: She is alert and oriented to person, place, and time.  Psychiatric:        Behavior: Behavior normal.        Thought Content: Thought content normal.        Judgment: Judgment normal.   No visible lesions on labia, vaginal area or cervix on visual inspection  Prenatal labs: ABO, Rh: A/Positive/-- (01/13 1056) Antibody: Negative (01/13 1056) Rubella: 6.38 (01/13 1056) RPR: Non Reactive (04/22 0822)  HBsAg: Negative (01/13 1056)  HIV: Non Reactive (04/22 04-13-1969)  GBS: Positive/-- (06/01 1101)   Assessment/Plan: G2P0010 at [redacted]w[redacted]d admitted for preeclampsia with severe features based on headache PEC labs pending GBS positive Hx HSV, last outbreak 1 year prior to today's admission, on suppression  Admit to L&D IOL, start with Cytotec  Magnesium sulfate 4g bolus then 2g/hour Penicillin for GBS prophylaxis Anticipate NSVD  [redacted]w[redacted]d, CNM 1:50 AM

## 2021-02-27 NOTE — Progress Notes (Addendum)
Labor Progress Note Jessica Stuart is a 25 y.o. G2P0010 at [redacted]w[redacted]d presented for IOL secondary to preeclampsia with severe features S: No concerns at this time  O:  BP 124/74   Pulse 76   Temp 98.8 F (37.1 C) (Oral)   Resp 18   Ht 5\' 9"  (1.753 m)   Wt (!) 142.9 kg   LMP 06/08/2020   SpO2 99%   BMI 46.52 kg/m  EFM: baseline 125/moderate variability/+accels/no decels  CVE: Dilation: 1 Effacement (%): 50 Cervical Position: Posterior Station: -3 Presentation: Vertex Exam by:: Dr. [email or web address]   A&P: 25 y.o. G2P0010 [redacted]w[redacted]d for IOL secondary to preeclampsia with severe features #IOL: s/p Cytotec x3 (2035). Now s/p CC (2112). Consider starting Pitocin at next check #Severe preeclampsia: based on HA. on magnesium. Intermittent mild range pressures as  high as 150s/90s. Labs reassuring. #Pain: per patient request #FWB: cat I #GBS positive on vancomycin #marginal cord: will send placenta to pathology #history of HSV: on suppression this pregnancy, no active lesions  08-28-1986, MD 10:18 PM   I saw and evaluated the patient. I agree with the findings and the plan of care as documented in the resident's note.  Littie Deeds, MD [facility] Family Medicine Fellow, [facility] Care for [facility], [facility]

## 2021-02-27 NOTE — Progress Notes (Signed)
Called to bedside by RN to scan--patient cephalic by BSUS.  Alric Seton, MD OB Fellow, Faculty [facility], Center for [facility] 02/27/2021 10:31 AM

## 2021-02-27 NOTE — Progress Notes (Signed)
Patient ID: Jessica Stuart, female   DOB: 13-Mar-1996, 25 y.o.   MRN: [id number] Doing well Still has mild headache despite Tylenol  Vitals:   02/27/21 0346 02/27/21 0401 02/27/21 0416 02/27/21 0430  BP: (!) 147/67 (!) 140/59 (!) 141/63   Pulse: 82 81 89   Resp:      Temp:    98.2 F (36.8 C)  TempSrc:    Oral  SpO2:      Weight:      Height:       FHR reassuring UCs irregular   Dilation: 1 Effacement (%): 50 Exam by:: [place],CNM  Discussed recommendation to use Cytotec with Foley balloon Wants to wait for her friend to get here  Magnesium Sulfate infusing for severe range pressures (earlier) and headache  Will proceed when pt allows

## 2021-02-28 ENCOUNTER — Encounter (HOSPITAL_COMMUNITY): Payer: Self-pay | Admitting: Obstetrics & Gynecology

## 2021-02-28 DIAGNOSIS — O134 Gestational [pregnancy-induced] hypertension without significant proteinuria, complicating childbirth: Secondary | ICD-10-CM

## 2021-02-28 DIAGNOSIS — O9852 Other viral diseases complicating childbirth: Secondary | ICD-10-CM

## 2021-02-28 DIAGNOSIS — O99820 Streptococcus B carrier state complicating pregnancy: Secondary | ICD-10-CM

## 2021-02-28 DIAGNOSIS — Z3A38 38 weeks gestation of pregnancy: Secondary | ICD-10-CM

## 2021-02-28 DIAGNOSIS — O1414 Severe pre-eclampsia complicating childbirth: Secondary | ICD-10-CM

## 2021-02-28 LAB — COMPREHENSIVE METABOLIC PANEL
ALT: 17 U/L (ref 0–44)
AST: 18 U/L (ref 15–41)
Albumin: 2.4 g/dL — ABNORMAL LOW (ref 3.5–5.0)
Alkaline Phosphatase: 104 U/L (ref 38–126)
Anion gap: 8 (ref 5–15)
BUN: 5 mg/dL — ABNORMAL LOW (ref 6–20)
CO2: 20 mmol/L — ABNORMAL LOW (ref 22–32)
Calcium: 7.3 mg/dL — ABNORMAL LOW (ref 8.9–10.3)
Chloride: 107 mmol/L (ref 98–111)
Creatinine, Ser: 0.6 mg/dL (ref 0.44–1.00)
GFR, Estimated: >60 mL/min (ref >60)
Glucose, Bld: 97 mg/dL (ref 70–99)
Potassium: 3.4 mmol/L — ABNORMAL LOW (ref 3.5–5.1)
Sodium: 135 mmol/L (ref 135–145)
Total Bilirubin: 0.6 mg/dL (ref 0.3–1.2)
Total Protein: 6.4 g/dL — ABNORMAL LOW (ref 6.5–8.1)

## 2021-02-28 LAB — CBC
HCT: 35.4 % — ABNORMAL LOW (ref 36.0–46.0)
Hemoglobin: 11.1 g/dL — ABNORMAL LOW (ref 12.0–15.0)
MCH: 25.2 pg — ABNORMAL LOW (ref 26.0–34.0)
MCHC: 31.4 g/dL (ref 30.0–36.0)
MCV: 80.3 fL (ref 80.0–100.0)
Platelets: 234 10*3/uL (ref 150–400)
RBC: 4.41 MIL/uL (ref 3.87–5.11)
RDW: 15.3 % (ref 11.5–15.5)
WBC: 11.5 10*3/uL — ABNORMAL HIGH (ref 4.0–10.5)
nRBC: 0 % (ref 0.0–0.2)

## 2021-02-28 MED ORDER — SENNOSIDES-DOCUSATE SODIUM 8.6-50 MG PO TABS
2.0000 | ORAL_TABLET | ORAL | Status: DC
  Administered 2021-02-28 – 2021-03-01 (×2): 2 via ORAL
  Filled 2021-02-28 (×2): qty 2

## 2021-02-28 MED ORDER — TETANUS-DIPHTH-ACELL PERTUSSIS 5-2.5-18.5 LF-MCG/0.5 IM SUSY: 0.5000 mL | PREFILLED_SYRINGE | Freq: Once | INTRAMUSCULAR | Status: DC

## 2021-02-28 MED ORDER — MEASLES, MUMPS & RUBELLA VAC IJ SOLR: 0.5000 mL | Freq: Once | INTRAMUSCULAR | Status: DC

## 2021-02-28 MED ORDER — LACTATED RINGERS IV SOLN: INTRAVENOUS | Status: DC

## 2021-02-28 MED ORDER — ACETAMINOPHEN 325 MG PO TABS: 650.0000 mg | ORAL_TABLET | ORAL | Status: DC | PRN

## 2021-02-28 MED ORDER — DIPHENHYDRAMINE HCL 25 MG PO CAPS: 25.0000 mg | ORAL_CAPSULE | Freq: Four times a day (QID) | ORAL | Status: DC | PRN

## 2021-02-28 MED ORDER — DIBUCAINE (PERIANAL) 1 % EX OINT: 1.0000 "application " | TOPICAL_OINTMENT | CUTANEOUS | Status: DC | PRN

## 2021-02-28 MED ORDER — TRANEXAMIC ACID-NACL 1000-0.7 MG/100ML-% IV SOLN
INTRAVENOUS | Status: AC
  Administered 2021-02-28: 1000 mg
  Filled 2021-02-28: qty 100

## 2021-02-28 MED ORDER — COCONUT OIL OIL
1.0000 "application " | TOPICAL_OIL | Status: DC | PRN
  Administered 2021-02-28: 1 via TOPICAL

## 2021-02-28 MED ORDER — MAGNESIUM SULFATE 40 GM/1000ML IV SOLN
2.0000 g/h | INTRAVENOUS | Status: AC
  Administered 2021-02-28 – 2021-03-01 (×2): 2 g/h via INTRAVENOUS
  Filled 2021-02-28: qty 1000

## 2021-02-28 MED ORDER — PRENATAL MULTIVITAMIN CH
1.0000 | ORAL_TABLET | Freq: Every day | ORAL | Status: DC
  Administered 2021-03-01: 1 via ORAL
  Filled 2021-02-28: qty 1

## 2021-02-28 MED ORDER — SIMETHICONE 80 MG PO CHEW: 80.0000 mg | CHEWABLE_TABLET | ORAL | Status: DC | PRN

## 2021-02-28 MED ORDER — IBUPROFEN 600 MG PO TABS
600.0000 mg | ORAL_TABLET | Freq: Four times a day (QID) | ORAL | Status: DC
  Administered 2021-02-28 – 2021-03-02 (×6): 600 mg via ORAL
  Filled 2021-02-28 (×6): qty 1

## 2021-02-28 MED ORDER — OXYTOCIN-SODIUM CHLORIDE 30-0.9 UT/500ML-% IV SOLN
1.0000 m[IU]/min | INTRAVENOUS | Status: DC
  Administered 2021-02-28: 2 m[IU]/min via INTRAVENOUS
  Filled 2021-02-28: qty 500

## 2021-02-28 MED ORDER — TRANEXAMIC ACID-NACL 1000-0.7 MG/100ML-% IV SOLN: 1000.0000 mg | INTRAVENOUS | Status: DC

## 2021-02-28 MED ORDER — ONDANSETRON HCL 4 MG/2ML IJ SOLN: 4.0000 mg | INTRAMUSCULAR | Status: DC | PRN

## 2021-02-28 MED ORDER — LACTATED RINGERS AMNIOINFUSION
INTRAVENOUS | Status: DC
  Filled 2021-02-28 (×2): qty 1000

## 2021-02-28 MED ORDER — MAGNESIUM SULFATE 40 GM/1000ML IV SOLN
2.0000 g/h | INTRAVENOUS | Status: DC
  Administered 2021-02-28: 2 g/h via INTRAVENOUS
  Filled 2021-02-28: qty 1000

## 2021-02-28 MED ORDER — ONDANSETRON HCL 4 MG PO TABS: 4.0000 mg | ORAL_TABLET | ORAL | Status: DC | PRN

## 2021-02-28 MED ORDER — BENZOCAINE-MENTHOL 20-0.5 % EX AERO
1.0000 "application " | INHALATION_SPRAY | CUTANEOUS | Status: DC | PRN
  Administered 2021-02-28: 1 via TOPICAL
  Filled 2021-02-28: qty 56

## 2021-02-28 MED ORDER — WITCH HAZEL-GLYCERIN EX PADS: 1.0000 "application " | MEDICATED_PAD | CUTANEOUS | Status: DC | PRN

## 2021-02-28 NOTE — Progress Notes (Signed)
Labor Progress Note Jessica Stuart is a 25 y.o. G2P0010 at [redacted]w[redacted]d presented for IOL secondary to preeclampsia with severe features  S: New headache recently, but pt attributes this to not eating. Said headache was not present this morning.  O:  BP (!) 153/89   Pulse 72   Temp 97.7 F (36.5 C) (Oral)   Resp 16   Ht 5\' 9"  (1.753 m)   Wt (!) 142.9 kg   LMP 06/08/2020   SpO2 99%   BMI 46.52 kg/m  EFM: baseline 130/moderate variability/+accels/no decels Ctx q 3-5 minutes  CVE: Dilation: 6 Effacement (%): 50 Cervical Position: Posterior Station: -2 Presentation: Vertex Exam by:: dr Hardy Harcum   A&P: 25 y.o. G2P0010 [redacted]w[redacted]d for IOL secondary to preeclampsia with severe features  #IOL: s/p cytotec and FB. Started on pitocin at [phone]. Head has come down slightly so performed AROM. Also placed FSE and IUPC to help with monitoring and pit titration #Severe preeclampsia: HA has returned. UPC .08. On magnesium. Intermittent mild range pressures as  high as 150s/90s. Labs reassuring. 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

## 2021-02-28 NOTE — Discharge Summary (Signed)
  Postpartum Discharge Summary    Patient Name: Jessica Stuart DOB: 01/14/1996 MRN: [id number]  Date of admission: 02/26/2021 Delivery date:02/28/2021  Delivering provider: FIRESTONE, ALICIA C  Date of discharge: 03/02/2021  Admitting diagnosis: Preeclampsia, severe, third trimester [O14.13] Intrauterine pregnancy: [redacted]w[redacted]d     Secondary diagnosis:  Active Problems:   Herpes genitalis in women   Supervision of other normal pregnancy, antepartum   Herpes simplex type 2 (HSV-2) infection affecting pregnancy, antepartum   Obesity in pregnancy, antepartum   Alpha thalassemia silent carrier   Marginal insertion of umbilical cord affecting management of mother   Positive GBS test   Preeclampsia, severe, third trimester   Vacuum-assisted vaginal delivery  Additional problems: none    Discharge diagnosis: Term Pregnancy Delivered and Preeclampsia (severe)                                              Post partum procedures: Magnesium sulfate prophylaxis Augmentation: AROM, Pitocin, Cytotec, and IP Foley Complications: None  Hospital course: Induction of Labor With Vaginal Delivery   25 y.o. yo G2P0010 at [redacted]w[redacted]d was admitted to the hospital 02/26/2021 for induction of labor.  Indication for induction:Severe  Preeclampsia.  Patient had an uncomplicated labor course as follows: Membrane Rupture Time/Date: 11:13 AM ,02/28/2021   Delivery Method:Vaginal, Vacuum (Extractor)  Episiotomy: None  Lacerations:  2nd degree;Periurethral  Details of delivery can be found in separate delivery note.  Patient received intrapartum and postpartum magnesium sulfate for eclampsia prophylaxis as per protocol. BP control was obtained with Procardia XL, no further immediate complications.  Otherwise, patient had a routine postpartum course. Patient is discharged home on 03/02/2021 and will follow up in the office for BP check within the week.  Newborn Data: Birth date:02/28/2021  Birth time:6:00 PM  Gender:Female   Living status:Living  Apgars:7 ,9  Weight:3195 g   Magnesium Sulfate received: Yes: Seizure prophylaxis BMZ received: No Rhophylac:N/A MMR:N/A   Physical exam  Vitals:   03/01/21 2338 03/02/21 0449 03/02/21 0823 03/02/21 1138  BP: (!) 118/54 (!) 110/53 120/64 (!) 142/58  Pulse: 80 75 85 87  Resp: 19 16 16 16  Temp: 98.2 F (36.8 C) 98 F (36.7 C) 98.4 F (36.9 C) 98.2 F (36.8 C)  TempSrc: Oral Oral Oral Oral  SpO2: 98% 99% 100% 100%  Weight:      Height:       General: alert, cooperative, and no distress Lochia: appropriate Uterine Fundus: firm Incision: Healing well with no significant drainage, No significant erythema, Dressing is clean, dry, and intact DVT Evaluation: No evidence of DVT seen on physical exam. Negative Homan's sign. No significant calf/ankle edema. Labs: Lab Results  Component Value Date   WBC 16.8 (H) 03/01/2021   HGB 9.0 (L) 03/01/2021   HCT 28.9 (L) 03/01/2021   MCV 81.0 03/01/2021   PLT 237 03/01/2021   CMP Latest Ref Rng & Units 02/28/2021  Glucose 70 - 99 mg/dL 97  BUN 6 - 20 mg/dL 5(L)  Creatinine 0.44 - 1.00 mg/dL 0.60  Sodium 135 - 145 mmol/L 135  Potassium 3.5 - 5.1 mmol/L 3.4(L)  Chloride 98 - 111 mmol/L 107  CO2 22 - 32 mmol/L 20(L)  Calcium 8.9 - 10.3 mg/dL 7.3(L)  Total Protein 6.5 - 8.1 g/dL 6.4(L)  Total Bilirubin 0.3 - 1.2 mg/dL 0.6  Alkaline Phos 38 - 126 U/L   104  AST 15 - 41 U/L 18  ALT 0 - 44 U/L 17   Edinburgh Score: Edinburgh Postnatal Depression Scale Screening Tool 02/28/2021  I have been able to laugh and see the funny side of things. 0  I have looked forward with enjoyment to things. 0  I have blamed myself unnecessarily when things went wrong. 1  I have been anxious or worried for no good reason. 0  I have felt scared or panicky for no good reason. 0  Things have been getting on top of me. 0  I have been so unhappy that I have had difficulty sleeping. 0  I have felt sad or miserable. 0  I have been so  unhappy that I have been crying. 0  The thought of harming myself has occurred to me. 0  Edinburgh Postnatal Depression Scale Total 1     After visit meds:  Allergies as of 03/02/2021       Reactions   Ceclor [cefaclor] Nausea And Vomiting, Swelling        Medication List     STOP taking these medications    aspirin 81 MG chewable tablet   terconazole 0.8 % vaginal cream Commonly known as: TERAZOL 3   valACYclovir 500 MG tablet Commonly known as: Valtrex       TAKE these medications    Blood Pressure Monitor Automat Devi 1 Device by Does not apply route daily. Automatic blood pressure cuff regular size. To monitor blood pressure regularly at home. ICD-10 code:Z34.90   Gojji Weight Scale Misc 1 Device by Does not apply route daily as needed. To weight self daily as needed at home. ICD-10 code: Z34.90   ibuprofen 600 MG tablet Commonly known as: ADVIL Take 1 tablet (600 mg total) by mouth every 6 (six) hours as needed for headache, mild pain or moderate pain.   NIFEdipine 60 MG 24 hr tablet Commonly known as: ADALAT CC Take 1 tablet (60 mg total) by mouth daily.   oxyCODONE 5 MG immediate release tablet Commonly known as: Oxy IR/ROXICODONE Take 1 tablet (5 mg total) by mouth every 4 (four) hours as needed for severe pain or breakthrough pain.   PRENATAL ADULT GUMMY/DHA/FA PO Take 2 each by mouth daily at 6 (six) AM.   senna-docusate 8.6-50 MG tablet Commonly known as: Senokot-S Take 2 tablets by mouth at bedtime as needed for mild constipation or moderate constipation.               Discharge Care Instructions  (From admission, onward)           Start     Ordered   03/02/21 0000  Discharge wound care:       Comments: As per discharge handout and nursing instructions   03/02/21 1158             Discharge home in stable condition Infant Feeding: Breast Infant Disposition:home with mother Discharge instruction: per After Visit Summary  and Postpartum booklet. Activity: Advance as tolerated. Pelvic rest for 6 weeks.  Diet: routine diet Future Appointments: Future Appointments  Date Time Provider Department Center  03/09/2021  9:10 AM [facility] NURSE [facility] None  04/15/2021  1:10 PM Weinhold, Samantha C, CNM [facility] None   Follow up Visit: Message sent to [facility] 02/28/21 by [facility].   Please schedule this patient for a In person postpartum visit in 4 weeks with the following provider: Any provider. Additional Postpartum F/U:BP check 1 week  High risk pregnancy complicated   by: HTN Delivery mode:  Vaginal, Vacuum (Extractor)  Anticipated Birth Control:  Unsure   03/02/2021 Ugonna Anyanwu, MD    

## 2021-02-28 NOTE — Progress Notes (Signed)
Labor Progress Note Jessica Stuart is a 25 y.o. G2P0010 at [redacted]w[redacted]d presented for IOL secondary to preeclampsia with severe features  S: Pt very drowsy. Had recently gotten a dose of fentanyl. States she is feeling some pain with contractions, but no other concerns. No headache currently  O:  BP (!) 142/69   Pulse 71   Temp 98.1 F (36.7 C) (Oral)   Resp 18   Ht 5\' 9"  (1.753 m)   Wt (!) 142.9 kg   LMP 06/08/2020   SpO2 99%   BMI 46.52 kg/m  EFM: baseline 120/moderate variability/no accels/no decels Ctx q 2-3 minutes  CVE: Dilation: 6 Effacement (%): 80 Cervical Position: Middle Station: -1 Presentation: Vertex Exam by:: k fields, rn   A&P: 25 y.o. G2P0010 [redacted]w[redacted]d for IOL secondary to preeclampsia with severe features  #IOL: s/p cytotec and FB. Pitocin up to 9mu/min. S/p AROM. Still has FSE and IUPC in place. Will continue to uptitrate pitocin as needed #Severe preeclampsia: Currently asymptomatic, possible recent fentanyl dosing has improved HA. UPC .08. Labs reassuring. On magnesium. Received Labetalol 20mg , 40mg  and 80mg  for recurrent Bps in the 170s/90s. Did not need to receive hydralazine as BP has come down.  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

## 2021-02-28 NOTE — Progress Notes (Signed)
Labor Progress Note Jessica Stuart is a 25 y.o. G2P0010 at [redacted]w[redacted]d presented for IOL secondary to preeclampsia with severe features S: No concerns at this time, HA resolved awhile ago.   O:  BP (!) 146/89   Pulse 70   Temp 98.8 F (37.1 C) (Oral)   Resp 17   Ht 5\' 9"  (1.753 m)   Wt (!) 142.9 kg   LMP 06/08/2020   SpO2 99%   BMI 46.52 kg/m  EFM: baseline 140/moderate variability/+accels/no decels  CVE: Dilation: 5 Effacement (%): 60 Cervical Position: Posterior Station: -3 Presentation: Vertex Exam by:: [email or web address], RN   A&P: 25 y.o. G2P0010 [redacted]w[redacted]d for IOL secondary to preeclampsia with severe features #IOL: s/p cytotec and FB. Started on pitocin at [phone].  #Severe preeclampsia: based on HA. HA has now resolved. UPC .08. On magnesium. Intermittent mild range pressures as  high as 150s/90s. Labs reassuring. 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

## 2021-02-28 NOTE — Lactation Note (Signed)
This note was copied from a baby's chart. Lactation Consultation Note  Patient Name: Jessica Stuart DPOEU'M Date: 02/28/2021 Reason for consult: L&D Initial assessment;Term;Primapara;1st time breastfeeding Age:26 hours  Visited with mom of 1 hour old FT female, she's a P1 and reported moderate breast changes during the pregnancy. LC assisted with hand expression and latching, mom able to easily get colostrum off her left breast, praised her for her efforts.  LC took baby STS to mother's left breast but he wouldn't latch. When doing suck training, noted that tongue was short and he would try thrusting gloved finger, could not establish sucking pattern; an attempt was documented in flowsheets and baby was left doing STS with mom.  Reviewed normal newborn behavior, cluster feeding, feeding cues, size of baby's stomach and lactogenesis II.  Feeding plan:  Encouraged mom to feed baby STS 8-12 times/24 hours or sooner if feeding cues are present Hand expression and spoon/finger feeding were also encouraged  No literature provided due to the nature of this L&D consultation. GOB (maternal) and mom's cousin were her support people. Family reported all questions and concerns were answered, they're all aware of LC OP services and will call PRN.  Maternal Data Has patient been taught Hand Expression?: Yes Does the patient have breastfeeding experience prior to this delivery?: No  Feeding Mother's Current Feeding Choice: Breast Milk  LATCH Score Latch: Too sleepy or reluctant, no latch achieved, no sucking elicited. (only a few sucks, baby would cue, but won't suck not even with suck training, still transitioning)  Audible Swallowing: None  Type of Nipple: Everted at rest and after stimulation  Comfort (Breast/Nipple): Soft / non-tender  Hold (Positioning): Assistance needed to correctly position infant at breast and maintain latch.  LATCH Score: 5   Lactation Tools Discussed/Used     Interventions Interventions: Breast massage;Hand express;Breast feeding basics reviewed;Assisted with latch;Skin to skin;Breast compression;Adjust position  Discharge [facility] Program: No (but plans to sign up)  Consult Status Consult Status: Follow-up Date: 03/01/21 Follow-up type: In-patient    Jessica Stuart 02/28/2021, 7:17 PM

## 2021-02-28 NOTE — Progress Notes (Signed)
Reported to bedside as patient having variable decels with contractions. Patient feeling increased pressure and urge to push. Cervical exam 7/90/0. FHT cat 2, overall reassuring. Pitocin currently @20mL /hr. FSE/IUPC in place. Will continue to monitor and consider amnioinfusion if decels continue.  , MD OB Fellow, Faculty [facility], [facility] 02/28/2021 4:31 PM

## 2021-03-01 ENCOUNTER — Encounter (HOSPITAL_COMMUNITY): Payer: Self-pay | Admitting: Obstetrics & Gynecology

## 2021-03-01 LAB — CBC
HCT: 28.9 % — ABNORMAL LOW (ref 36.0–46.0)
Hemoglobin: 9 g/dL — ABNORMAL LOW (ref 12.0–15.0)
MCH: 25.2 pg — ABNORMAL LOW (ref 26.0–34.0)
MCHC: 31.1 g/dL (ref 30.0–36.0)
MCV: 81 fL (ref 80.0–100.0)
Platelets: 237 10*3/uL (ref 150–400)
RBC: 3.57 MIL/uL — ABNORMAL LOW (ref 3.87–5.11)
RDW: 15.7 % — ABNORMAL HIGH (ref 11.5–15.5)
WBC: 16.8 10*3/uL — ABNORMAL HIGH (ref 4.0–10.5)
nRBC: 0 % (ref 0.0–0.2)

## 2021-03-01 MED ORDER — NIFEDIPINE ER OSMOTIC RELEASE 30 MG PO TB24
30.0000 mg | ORAL_TABLET | Freq: Two times a day (BID) | ORAL | Status: DC
  Administered 2021-03-01 – 2021-03-02 (×3): 30 mg via ORAL
  Filled 2021-03-01 (×3): qty 1

## 2021-03-01 NOTE — Lactation Note (Signed)
This note was copied from a baby's chart. Lactation Consultation Note  Patient Name: Jessica Stuart ZOXWR'U Date: 03/01/2021 Reason for consult: Follow-up assessment;Term Age:26 hours  LC in to room for follow up. Per mother infant has been feeding well. Mother reports hand expressing and offering collected volume first. Mother is also supplementing with formula following volume recommendation. Discussed normal newborn behavior and patterns in addition to clusterfeeding. Mother states she has not started pumping yet.  Feeding plan:  1-Skin to skin 2-Aim for a deep, comfortable latch 3-Breastfeeding on demand or 8-12 times in 24h period. 4-Keep infant awake during breastfeeding session: massaging breast, infant's hand/shoulder/feet 5-Pump or hand-express and offer EBM prior to supplementation. 6-If needed, supplement following guidelines, paced bottle feeding and fullness cues.  7-Monitor voids and stools as signs good intake.  8-Encouraged maternal rest, hydration and food intake.  9-Contact LC as needed for feeds/support/concerns/questions    All questions answered at this time.   Feeding Mother's Current Feeding Choice: Breast Milk and Formula  Lactation Tools Discussed/Used Tools: Pump Breast pump type: Double-Electric Breast Pump Reason for Pumping: stimulation and supplementation Pumping frequency: Encouraged to pump after feedings Pumped volume:  (has not started yet)  Interventions Interventions: Breast feeding basics reviewed;Education;Skin to skin;Hand express;Expressed milk;DEBP   Consult Status Consult Status: Follow-up Date: 03/02/21 Follow-up type: In-patient    Gabriel Conry A Higuera Ancidey 03/01/2021, 2:01 PM

## 2021-03-01 NOTE — Lactation Note (Signed)
This note was copied from a baby's chart. Lactation Consultation Note Mom had a couple of nurses working w/her when [facility] came into room.  Mom had given baby formula several times. After weight done baby spit up twice. LC gave mom supplementation sheet of how much to give baby at what hours of age.  Mom has DEBP set up at bedside and has used it. Praised mom. Mom stated she only got a little bit. Praised mom again.  Mom demonstrated hand expression as she knew it. LC demonstrated hand expression w/dots of colostrum noted.  Informed mom Lactation would come back later when baby hasn't had so much formula. Encouraged to BF before giving formula. Give colostrum before formula.  Lactation brochure given. Lactation will f/u later.  Patient Name: Jessica Stuart [id number]'Y Date: 03/01/2021 Reason for consult: Initial assessment;Primapara;Term Age:26 hours  Maternal Data Has patient been taught Hand Expression?: Yes Does the patient have breastfeeding experience prior to this delivery?: No  Feeding Mother's Current Feeding Choice: Breast Milk and Formula Nipple Type: Slow - flow  LATCH Score                    Lactation Tools Discussed/Used Tools: Pump Breast pump type: Double-Electric Breast Pump  Interventions Interventions: DEBP;Hand express;Breast massage;Skin to skin;Position options;Support pillows;Breast feeding basics reviewed  Discharge    Consult Status Consult Status: Follow-up Date: 03/01/21 Follow-up type: In-patient    Charyl Dancer 03/01/2021, 6:33 AM

## 2021-03-01 NOTE — Progress Notes (Signed)
Post Partum Day 1 Subjective: no complaints, up ad lib, voiding, and tolerating PO  Objective: Blood pressure (!) 116/59, pulse 83, temperature 98.4 F (36.9 C), temperature source Oral, resp. rate 17, height 5\' 9"  (1.753 m), weight (!) 142.9 kg, last menstrual period 06/08/2020, SpO2 99 %, unknown if currently breastfeeding.  Intake/Output Summary (Last 24 hours) at 03/01/2021 1028 Last data filed at 03/01/2021 0900 Gross per 24 hour  Intake 4798.7 ml  Output 4304 ml  Net 494.7 ml   Vitals:   03/01/21 0350 03/01/21 0739 03/01/21 0845 03/01/21 0950  BP: (!) 127/45 (!) 116/59    Pulse: 98 83    Temp: 98.4 F (36.9 C) 98.4 F (36.9 C)    Resp: 18 16 18 17   Height:      Weight:      SpO2: 99% 99%    TempSrc: Oral Oral    BMI (Calculated):        Physical Exam:  General: alert, cooperative, and appears stated age 25: appropriate Uterine Fundus: firm DVT Evaluation: No evidence of DVT seen on physical exam.  Recent Labs    02/27/21 0116 02/28/21 1248  HGB 11.1* 11.1*  HCT 35.1* 35.4*    Assessment/Plan: Plan for discharge tomorrow, Breastfeeding, and Contraception none Plans circ Magnesium x 24 hours Add Procardia   LOS: 2 days   03/01/21 03/01/2021, 10:27 AM

## 2021-03-02 ENCOUNTER — Other Ambulatory Visit (HOSPITAL_COMMUNITY): Payer: Self-pay

## 2021-03-02 MED ORDER — OXYCODONE HCL 5 MG PO TABS
5.0000 mg | ORAL_TABLET | ORAL | 0 refills | Status: DC | PRN
  Filled 2021-03-02: qty 15, 3d supply, fill #0

## 2021-03-02 MED ORDER — SENNOSIDES-DOCUSATE SODIUM 8.6-50 MG PO TABS: 2.0000 | ORAL_TABLET | Freq: Every evening | ORAL | 0 refills | Status: DC | PRN

## 2021-03-02 MED ORDER — SENNOSIDES-DOCUSATE SODIUM 8.6-50 MG PO TABS
2.0000 | ORAL_TABLET | Freq: Every evening | ORAL | 1 refills | Status: DC | PRN
  Filled 2021-03-02: qty 30, 15d supply, fill #0

## 2021-03-02 MED ORDER — IBUPROFEN 600 MG PO TABS
600.0000 mg | ORAL_TABLET | Freq: Four times a day (QID) | ORAL | 0 refills | Status: DC | PRN
Start: 2021-03-02 — End: 2021-03-02

## 2021-03-02 MED ORDER — NIFEDIPINE ER 60 MG PO TB24: 60.0000 mg | ORAL_TABLET | Freq: Every day | ORAL | 2 refills | Status: DC

## 2021-03-02 MED ORDER — NIFEDIPINE ER 60 MG PO TB24
60.0000 mg | ORAL_TABLET | Freq: Every day | ORAL | 0 refills | Status: DC
  Filled 2021-03-02: qty 30, 30d supply, fill #0

## 2021-03-02 MED ORDER — IBUPROFEN 600 MG PO TABS
600.0000 mg | ORAL_TABLET | Freq: Four times a day (QID) | ORAL | 1 refills | Status: DC | PRN
Start: 2021-03-02 — End: 2022-07-01
  Filled 2021-03-02: qty 30, 8d supply, fill #0

## 2021-03-02 MED ORDER — OXYCODONE HCL 5 MG PO TABS: 5.0000 mg | ORAL_TABLET | ORAL | 0 refills | Status: DC | PRN

## 2021-03-02 NOTE — Lactation Note (Signed)
This note was copied from a baby's chart. Lactation Consultation Note  Patient Name: Boy Jordana Dugue VVZSM'[id number] Date: 03/02/2021 Reason for consult: Follow-up assessment;Primapara;1st time breastfeeding;Term Age:26 hours  LC in to speak with P1 Mom of term baby on day of discharge.  Baby at 4% weight loss.  Mom states she has been putting baby to the breast before supplementing with formula by bottle.  Mom feels she doesn't have much.  Talked about supply meets demand and encouraged Mom to pump whenever she is supplementing to support a full milk supply.  Mom voiced she isn't sure she wants to exclusively breastfeed.  Engorgement prevention and treatment reviewed. Mom aware of OP lactation support available to her and encouraged to call prn.  Lactation Tools Discussed/Used Tools: Pump Breast pump type: Double-Electric Breast Pump;Manual Pumping frequency: X2  Interventions Interventions: Breast feeding basics reviewed;Skin to skin;Breast massage;Hand express;Education  Discharge Discharge Education: Engorgement and breast care Pump: Personal (a DEBP, unsure of name)  Consult Status Consult Status: Complete Date: 03/02/21 Follow-up type: Call as needed    Judee Clara 03/02/2021, 8:37 AM

## 2021-03-04 LAB — SURGICAL PATHOLOGY

## 2021-03-09 ENCOUNTER — Ambulatory Visit: Payer: Medicaid Other

## 2021-03-12 ENCOUNTER — Encounter: Payer: Self-pay | Admitting: Family Medicine

## 2021-03-13 ENCOUNTER — Encounter: Payer: Medicaid Other | Admitting: Obstetrics and Gynecology

## 2021-03-23 ENCOUNTER — Ambulatory Visit: Payer: Medicaid Other

## 2021-04-15 ENCOUNTER — Ambulatory Visit (INDEPENDENT_AMBULATORY_CARE_PROVIDER_SITE_OTHER): Payer: Medicaid Other | Admitting: Advanced Practice Midwife

## 2021-04-15 ENCOUNTER — Other Ambulatory Visit: Payer: Self-pay

## 2021-04-15 ENCOUNTER — Encounter: Payer: Self-pay | Admitting: Advanced Practice Midwife

## 2021-04-15 ENCOUNTER — Ambulatory Visit: Payer: Medicaid Other | Admitting: Advanced Practice Midwife

## 2021-04-15 DIAGNOSIS — O1413 Severe pre-eclampsia, third trimester: Secondary | ICD-10-CM

## 2021-04-15 DIAGNOSIS — B009 Herpesviral infection, unspecified: Secondary | ICD-10-CM

## 2021-04-15 DIAGNOSIS — O98519 Other viral diseases complicating pregnancy, unspecified trimester: Secondary | ICD-10-CM | POA: Diagnosis not present

## 2021-04-15 MED ORDER — NIFEDIPINE ER 60 MG PO TB24: 60.0000 mg | ORAL_TABLET | Freq: Every day | ORAL | 3 refills | Status: AC

## 2021-04-15 MED ORDER — VALACYCLOVIR HCL 500 MG PO TABS: 500.0000 mg | ORAL_TABLET | Freq: Two times a day (BID) | ORAL | 6 refills | Status: AC

## 2021-04-15 NOTE — Progress Notes (Signed)
Post Partum Visit Note  Jessica Stuart is a 25 y.o. G30P1011 female who presents for a postpartum visit. She is 6 weeks postpartum following a normal spontaneous vaginal delivery.  I have fully reviewed the prenatal and intrapartum course. The delivery was at 39 gestational weeks.  Anesthesia: none. Postpartum course has been uncomplicated. Baby is doing well. Baby is feeding by both breast and bottle - Lucien [employer] Start Gentle Soy . Bleeding no bleeding. Bowel function is normal. Bladder function is normal. Patient is not sexually active. Contraception method is none. Postpartum depression screening: negative.   The pregnancy intention screening data noted above was reviewed. Potential methods of contraception were discussed. Patient declines contraception today.   Edinburgh Postnatal Depression Scale - 04/15/21 1426       Edinburgh Postnatal Depression Scale:  In the Past 7 Days   I have been able to laugh and see the funny side of things. 0    I have looked forward with enjoyment to things. 0    I have blamed myself unnecessarily when things went wrong. 0    I have been anxious or worried for no good reason. 0    I have felt scared or panicky for no good reason. 0    Things have been getting on top of me. 0    I have been so unhappy that I have had difficulty sleeping. 0    I have felt sad or miserable. 0    I have been so unhappy that I have been crying. 0    The thought of harming myself has occurred to me. 0    Edinburgh Postnatal Depression Scale Total 0             Health Maintenance Due  Topic Date Due   COVID-19 Vaccine (1) Never done   HPV VACCINES (1 - 2-dose series) Never done   INFLUENZA VACCINE  04/13/2021    The following portions of the patient's history were reviewed and updated as appropriate: allergies, current medications, past family history, past medical history, past social history, past surgical history, and problem list.  Review of Systems A  comprehensive review of systems was negative.  Objective:  BP 117/82 (BP Location: Left Arm, Patient Position: Sitting, Cuff Size: Large) Comment (Cuff Size): Thigh Cuff  Pulse 87   Temp 98.5 F (36.9 C) (Oral)   Ht 5\' 9"  (1.753 m)   Wt (!) 314 lb (142.4 kg)   LMP 04/08/2021   Breastfeeding Yes   BMI 46.37 kg/m    General:  alert, cooperative, appears stated age, and no distress   Breasts:  not indicated  Lungs: Normal work of breathing, RR WNL  GU exam:  2nd degree perineal lac. Healing well, digital exam per patient request. No palpable or visible suture       Assessment:   Continue Procardia until cleared by PCP Pt declines discussion of birth control Perineal laceration healing appropriately Reviewed well woman guidelines for preventive health care  [facility] postpartum exam.   Plan:   Essential components of care per ACOG recommendations:  1.  Mood and well being: Patient with negative depression screening today. Reviewed local resources for support.    2. Infant care and feeding:  -Patient currently breastmilk feeding? Yes. Reviewed importance of draining breast regularly to support lactation.  -Social determinants of health (SDOH) reviewed in EPIC. No concerns  3. Sexuality, contraception and birth spacing - Patient does not want a pregnancy in the next  year.   - Patient declined discussion of birth control options. Referred to bedsider.org for home review when she feels ready  4. Sleep and fatigue -Encouraged family/partner/community support of 4 hrs of uninterrupted sleep to help with mood and fatigue  5. Physical Recovery  - Discussed patients delivery and complications. She describes her labor as good. - Patient had a  VAVD . Patient had a 2nd degree laceration. Perineal healing reviewed. Patient expressed understanding - Patient has urinary incontinence? No. - Patient is safe to resume physical and sexual activity  6.  Health Maintenance - HM due items  addressed Yes - Last pap smear  Diagnosis  Date Value Ref Range Status  09/25/2020   Final   - Negative for intraepithelial lesion or malignancy (NILM)   Pap smear not done at today's visit.  -Breast Cancer screening indicated? No.   7. Chronic Disease/Pregnancy Condition follow up: Hypertension, HSV, and obesity  - PCP follow up  Calvert Cantor, CNM Center for [employer], [facility]

## 2021-06-03 ENCOUNTER — Encounter (INDEPENDENT_AMBULATORY_CARE_PROVIDER_SITE_OTHER): Payer: Medicaid Other | Admitting: Primary Care

## 2021-06-03 NOTE — Progress Notes (Deleted)
  [facility]   Subjective:   Jessica Stuart is a 25 y.o. female presents for hospital follow up and establish care. Admit date to the hospital was 02/26/21, patient was discharged from the hospital on 03/02/21, patient was admitted for:   Past Medical History:  Diagnosis Date   Eczema      Allergies  Allergen Reactions   Ceclor [Cefaclor] Nausea And Vomiting and Swelling      Current Outpatient Medications on File Prior to Visit  Medication Sig Dispense Refill   Blood Pressure Monitoring (BLOOD PRESSURE MONITOR AUTOMAT) DEVI 1 Device by Does not apply route daily. Automatic blood pressure cuff regular size. To monitor blood pressure regularly at home. ICD-10 code:Z34.90 (Patient not taking: Reported on 04/15/2021) 1 each 0   ibuprofen (ADVIL) 600 MG tablet Take 1 tablet (600 mg total) by mouth every 6 (six) hours as needed for headache, mild pain or moderate pain. 30 tablet 1   Misc. Devices (GOJJI WEIGHT SCALE) MISC 1 Device by Does not apply route daily as needed. To weight self daily as needed at home. ICD-10 code: Z34.90 (Patient not taking: Reported on 04/15/2021) 1 each 0   NIFEdipine (ADALAT CC) 60 MG 24 hr tablet Take 1 tablet (60 mg total) by mouth daily. 30 tablet 3   oxyCODONE (OXY IR/ROXICODONE) 5 MG immediate release tablet Take 1 tablet (5 mg total) by mouth every 4 (four) hours as needed for severe pain or breakthrough pain. (Patient not taking: Reported on 04/15/2021) 15 tablet 0   Prenatal MV & Min w/FA-DHA (PRENATAL ADULT GUMMY/DHA/FA PO) Take 2 each by mouth daily at 6 (six) AM.     senna-docusate (SENOKOT-S) 8.6-50 MG tablet Take 2 tablets by mouth at bedtime as needed for mild constipation or moderate constipation. (Patient not taking: Reported on 04/15/2021) 30 tablet 1   valACYclovir (VALTREX) 500 MG tablet Take 1 tablet (500 mg total) by mouth 2 (two) times daily. 60 tablet 6   No current facility-administered medications on file prior to visit.      Review of System: ROS  Objective:  There were no vitals taken for this visit.  There were no vitals filed for this visit.  Physical Exam:   General Appearance: Well nourished, in no apparent distress. Eyes: PERRLA, EOMs, conjunctiva no swelling or erythema Sinuses: No Frontal/maxillary tenderness ENT/Mouth: Ext aud canals clear, TMs without erythema, bulging. No erythema, swelling, or exudate on post pharynx.  Tonsils not swollen or erythematous. Hearing normal.  Neck: Supple, thyroid normal.  Respiratory: Respiratory effort normal, BS equal bilaterally without rales, rhonchi, wheezing or stridor.  Cardio: RRR with no MRGs. Brisk peripheral pulses without edema.  Abdomen: Soft, + BS.  Non tender, no guarding, rebound, hernias, masses. Lymphatics: Non tender without lymphadenopathy.  Musculoskeletal: Full ROM, 5/5 strength, normal gait.  Skin: Warm, dry without rashes, lesions, ecchymosis.  Neuro: Cranial nerves intact. Normal muscle tone, no cerebellar symptoms. Sensation intact.  Psych: Awake and oriented X 3, normal affect, Insight and Judgment appropriate.    Assessment:   No diagnosis found.  No orders of the defined types were placed in this encounter.   This note has been created with Education officer, environmental. Any transcriptional errors are unintentional.   Grayce Sessions, NP 06/03/2021, 1:45 PM

## 2022-07-01 ENCOUNTER — Ambulatory Visit (HOSPITAL_COMMUNITY)
Admission: EM | Admit: 2022-07-01 | Discharge: 2022-07-01 | Disposition: A | Discharge status: Home / Self Care | Payer: Medicaid Other | Attending: Family Medicine | Admitting: Family Medicine

## 2022-07-01 ENCOUNTER — Encounter (HOSPITAL_COMMUNITY): Payer: Self-pay

## 2022-07-01 DIAGNOSIS — S0502XA Injury of conjunctiva and corneal abrasion without foreign body, left eye, initial encounter: Secondary | ICD-10-CM

## 2022-07-01 MED ORDER — GENTAMICIN SULFATE 0.3 % OP SOLN: 2.0000 [drp] | Freq: Three times a day (TID) | OPHTHALMIC | 0 refills | Status: AC

## 2022-07-01 MED ORDER — FLUORESCEIN SODIUM 1 MG OP STRP
ORAL_STRIP | OPHTHALMIC | Status: AC
  Filled 2022-07-01: qty 1

## 2022-07-01 MED ORDER — TETRACAINE HCL 0.5 % OP SOLN
OPHTHALMIC | Status: AC
  Filled 2022-07-01: qty 4

## 2022-07-01 MED ORDER — AMOXICILLIN-POT CLAVULANATE 875-125 MG PO TABS: 1.0000 | ORAL_TABLET | Freq: Two times a day (BID) | ORAL | 0 refills | Status: AC

## 2022-07-01 NOTE — ED Triage Notes (Signed)
Patient was washing her face 3 days ago. She had got 2B Glue from her hair into her false lases and in to her eye. States she went to sleep that night and woke up with her left eye glued to the pillow.  States the left eye had gotten better but when cleaning her bathroom yesterday the fumes from th cleaner aggravated  the eye again.

## 2022-07-01 NOTE — ED Provider Notes (Signed)
[facility]    CSN: [id number] Arrival date & time: 07/01/22  1254      History   Chief Complaint Chief Complaint  Patient presents with   Eye Injury    HPI Jessica Stuart is a 26 y.o. female.    Eye Injury   Here for pain in her left eye.  3 days ago when she was washing her face she got some hair gel type stuff in her left eye.  It is very irritated and she washed most of it out.  The next morning when she woke up her eyelid was stuck to the pillow.  Then it got better and yesterday evening she cleaned her shower with bleach, and she thinks the fumes at her eye worse.  Today she has swelling of the eyelid on the left.  She also has some redness and pain in the left eye.  No fever or chills.  The eye has been tearing but has not had any thick discharge  Past Medical History:  Diagnosis Date   Eczema     Patient Active Problem List   Diagnosis Date Noted   Vacuum-assisted vaginal delivery 02/28/2021   Preeclampsia, severe, third trimester 02/27/2021   Positive GBS test 02/19/2021   Alpha thalassemia silent carrier 01/02/2021   Marginal insertion of umbilical cord affecting management of mother 01/02/2021   Supervision of other normal pregnancy, antepartum 09/25/2020   Herpes simplex type 2 (HSV-2) infection affecting pregnancy, antepartum 09/25/2020   Obesity in pregnancy, antepartum 09/25/2020   Herpes genitalis in women 01/04/2017    Past Surgical History:  Procedure Laterality Date   NO PAST SURGERIES      OB History     Gravida  2   Para  1   Term  1   Preterm  0   AB  1   Living  1      SAB  1   IAB  0   Ectopic  0   Multiple  0   Live Births  1            Home Medications    Prior to Admission medications   Medication Sig Start Date End Date Taking? Authorizing Provider  amoxicillin-clavulanate (AUGMENTIN) 875-125 MG tablet Take 1 tablet by mouth 2 (two) times daily for 5 days. 07/01/22 07/06/22 Yes Stefanny Pieri,  Janace Aris, MD  gentamicin (GARAMYCIN) 0.3 % ophthalmic solution Place 2 drops into the left eye 3 (three) times daily for 5 days. 07/01/22 07/06/22 Yes Zenia Resides, MD  NIFEdipine (ADALAT CC) 60 MG 24 hr tablet Take 1 tablet (60 mg total) by mouth daily. 04/15/21 08/13/21  Calvert Cantor, CNM  valACYclovir (VALTREX) 500 MG tablet Take 1 tablet (500 mg total) by mouth 2 (two) times daily. 04/15/21   Calvert Cantor, CNM    Family History Family History  Problem Relation Age of Onset   Heart disease Mother     Social History Social History   Tobacco Use   Smoking status: Never   Smokeless tobacco: Never  Vaping Use   Vaping Use: Never used  Substance Use Topics   Alcohol use: Not Currently    Comment: social   Drug use: No     Allergies   Ceclor [cefaclor]   Review of Systems Review of Systems   Physical Exam Triage Vital Signs ED Triage Vitals  Enc Vitals Group     BP 07/01/22 1400 (!) 144/81     Pulse  Rate 07/01/22 1400 73     Resp 07/01/22 1400 16     Temp 07/01/22 1400 (!) 97.4 F (36.3 C)     Temp Source 07/01/22 1400 Oral     SpO2 07/01/22 1400 99 %     Weight --      Height --      Head Circumference --      Peak Flow --      Pain Score 07/01/22 1359 7     Pain Loc --      Pain Edu? --      Excl. in [place]? --    No data found.  Updated Vital Signs BP (!) 144/81 (BP Location: Left Wrist)   Pulse 73   Temp (!) 97.4 F (36.3 C) (Oral)   Resp 16   LMP 06/17/2022 (Approximate)   SpO2 99%   Breastfeeding No   Visual Acuity Right Eye Distance: 20/15 Left Eye Distance: 20/70 Bilateral Distance: 20/20  Right Eye Near:   Left Eye Near:    Bilateral Near:     Physical Exam Vitals reviewed.  Constitutional:      General: She is not in acute distress.    Appearance: She is not ill-appearing, toxic-appearing or diaphoretic.  Eyes:     Extraocular Movements: Extraocular movements intact.     Pupils: Pupils are equal, round, and  reactive to light.     Comments: Right eyes normal.  The left eye is very injected medially and laterally.  The lids are slightly edematous and erythematous.  Cardiovascular:     Rate and Rhythm: Normal rate and regular rhythm.  Pulmonary:     Effort: Pulmonary effort is normal.     Breath sounds: Normal breath sounds.  Skin:    Coloration: Skin is not jaundiced or pale.  Neurological:     General: No focal deficit present.     Mental Status: She is oriented to person, place, and time.      UC Treatments / Results  Labs (all labs ordered are listed, but only abnormal results are displayed) Labs Reviewed - No data to display  EKG   Radiology No results found.  Procedures Procedures (including critical care time)  Medications Ordered in UC Medications - No data to display  Initial Impression / Assessment and Plan / UC Course  I have reviewed the triage vital signs and the nursing notes.  Pertinent labs & imaging results that were available during my care of the patient were reviewed by me and considered in my medical decision making (see chart for details).        After verbal consent, tetracaine drops were applied to the left eye.  Fluorescein staining is done and she has some tiny spotty abrasions of her cornea laterally.  There are about 5 areas that are about a millimeter in diameter that appear to be an abrasion with a staining.  She will patch her eye when she is at home but not when she is driving.  Gentamicin drops are sent as is 5 days of oral antibiotics.  She is given contact information for ophthalmology Final Clinical Impressions(s) / UC Diagnoses   Final diagnoses:  Abrasion of left cornea, initial encounter     Discharge Instructions      There is some small corneal abrasions on the left part of your eye.  Put gentamicin eyedrops, which is an antibiotic, 2 drops in the left eye 3 times daily for 5 days  Take amoxicillin-clavulanate  875 mg--1  tab twice daily with food for 5 days  You can patch the eye while at home.  Do not do this while you are trying to drive  If you get worse in any way or you are not improving the next 24 to 48 hours, please go to the emergency room      ED Prescriptions     Medication Sig Dispense Auth. Provider   gentamicin (GARAMYCIN) 0.3 % ophthalmic solution Place 2 drops into the left eye 3 (three) times daily for 5 days. 5 mL Zenia Resides, MD   amoxicillin-clavulanate (AUGMENTIN) 875-125 MG tablet Take 1 tablet by mouth 2 (two) times daily for 5 days. 10 tablet Marlinda Mike Janace Aris, MD      PDMP not reviewed this encounter.   Zenia Resides, MD 07/01/22 1444

## 2022-07-01 NOTE — Discharge Instructions (Addendum)
There is some small corneal abrasions on the left part of your eye.  Put gentamicin eyedrops, which is an antibiotic, 2 drops in the left eye 3 times daily for 5 days  Take amoxicillin-clavulanate 875 mg--1 tab twice daily with food for 5 days  You can patch the eye while at home.  Do not do this while you are trying to drive  If you get worse in any way or you are not improving the next 24 to 48 hours, please go to the emergency room

## 2022-07-18 IMAGING — US US MFM FETAL BPP W/O NON-STRESS
1 series · 8 of 8 positions shown · non-contrast
Comparison: none

[Series 1: us mfm fetal bpp w/o non-stress · 8 acquisitions, 8 frames shown]
[im 1/8]
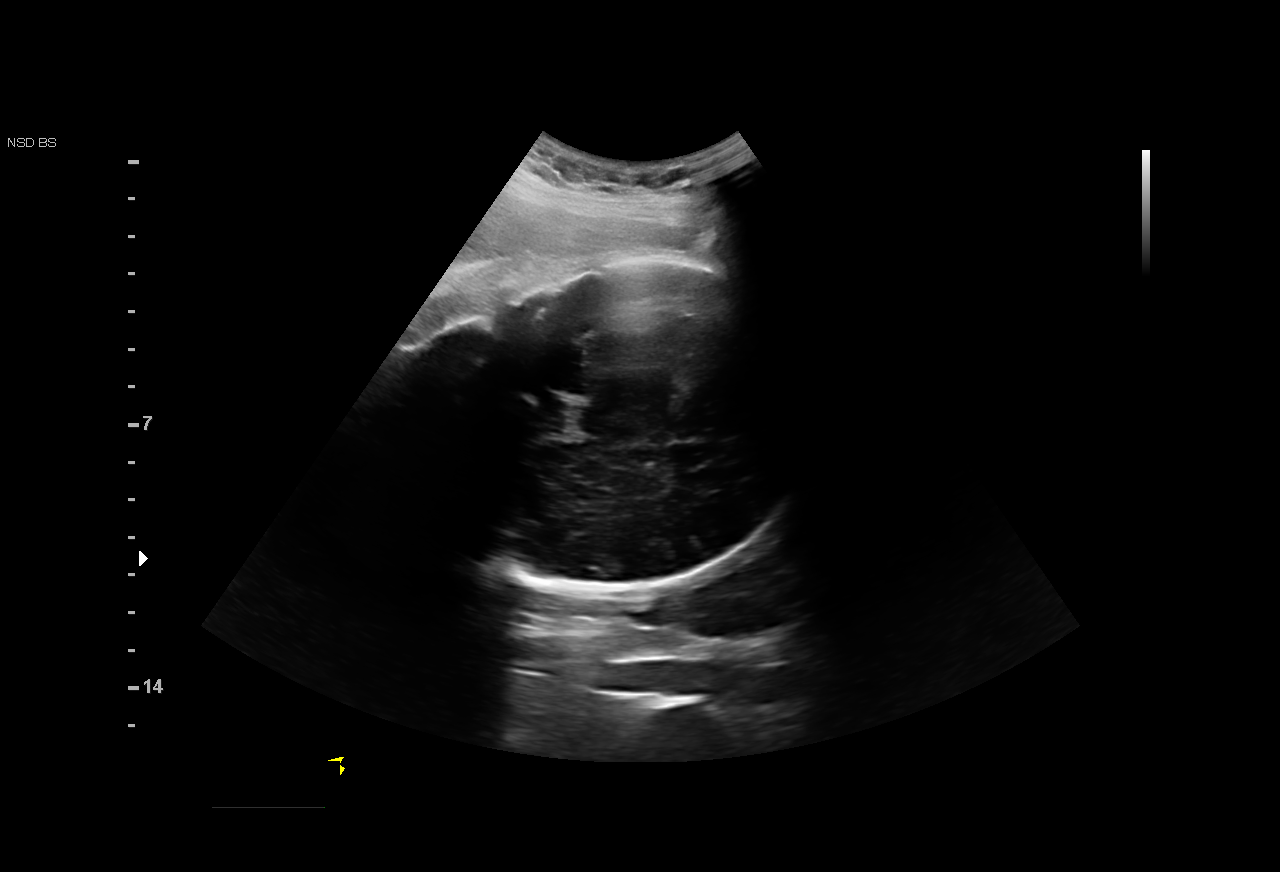
[im 2/8]
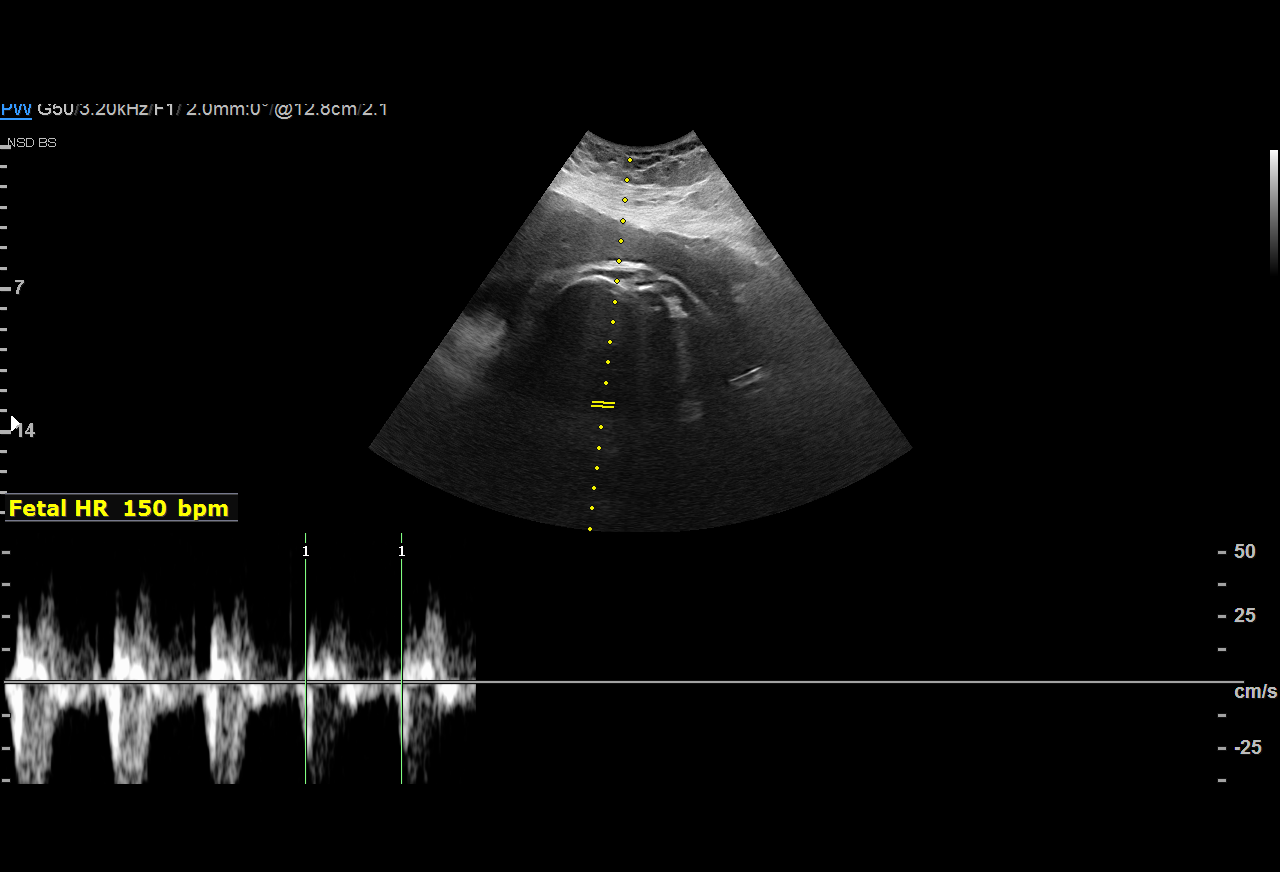
[im 3/8]
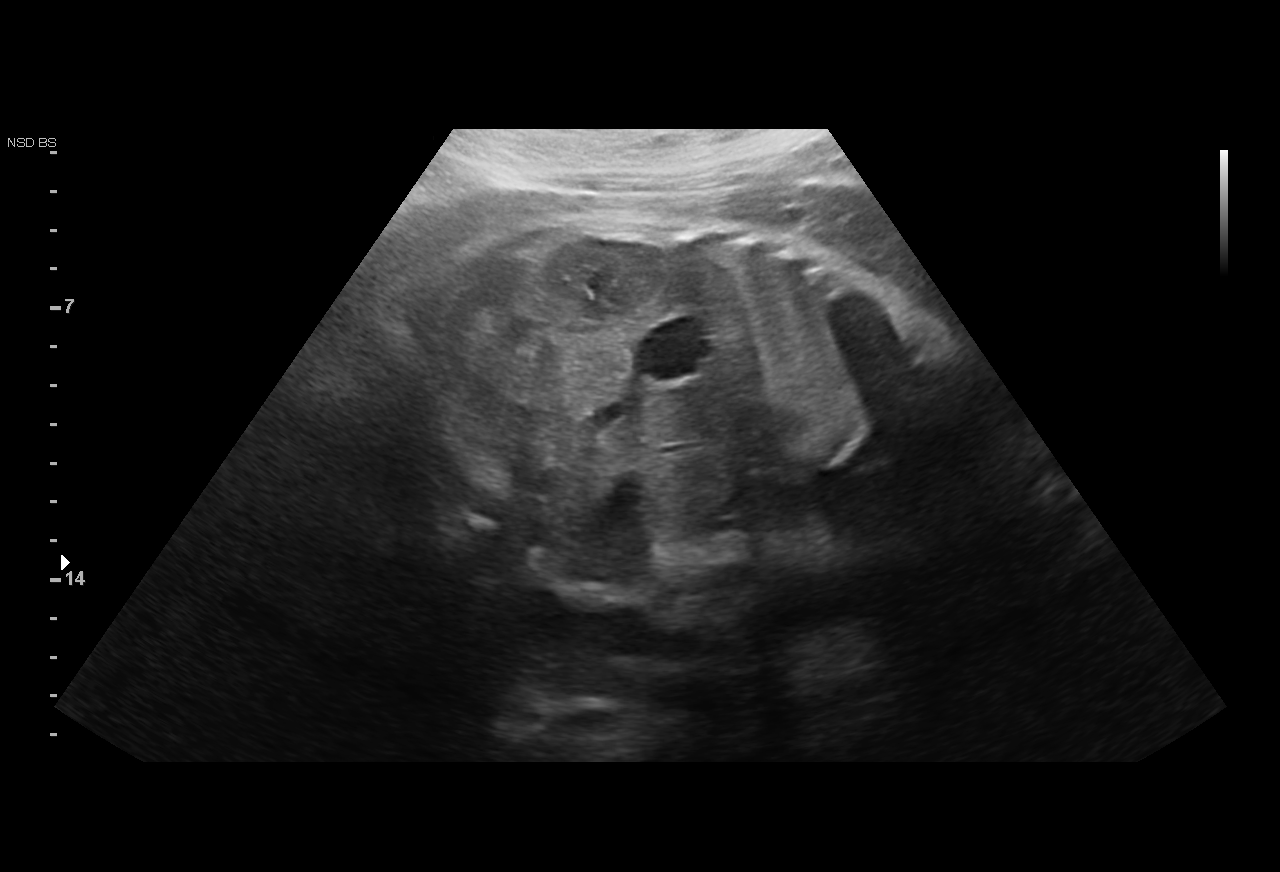
[im 4/8]
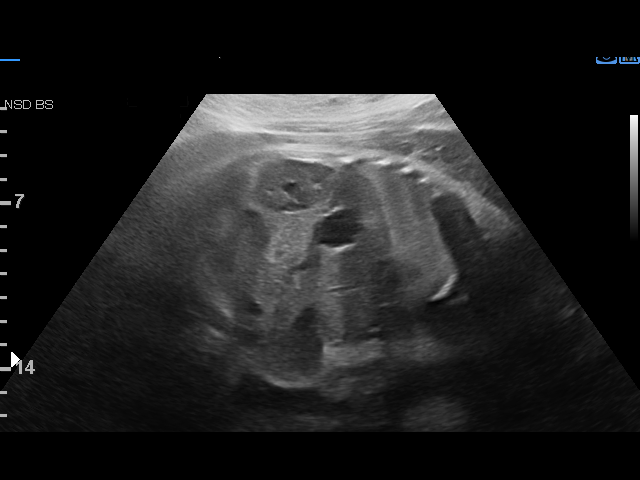
[im 5/8]
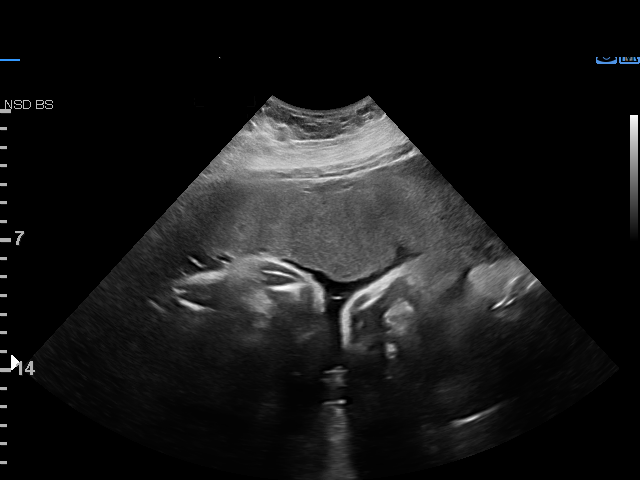
[im 6/8]
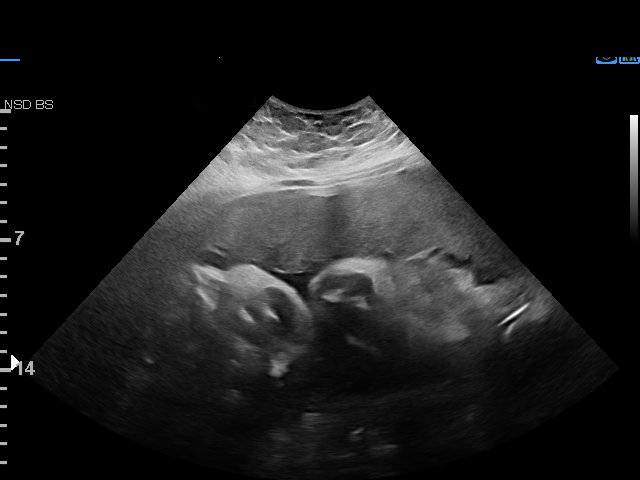
[im 7/8]
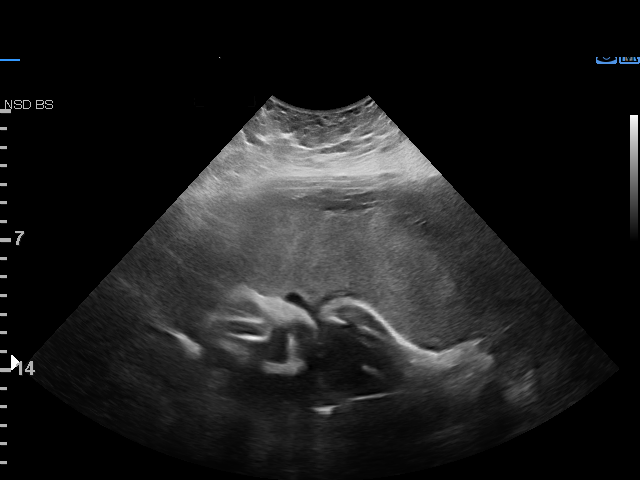
[im 8/8]
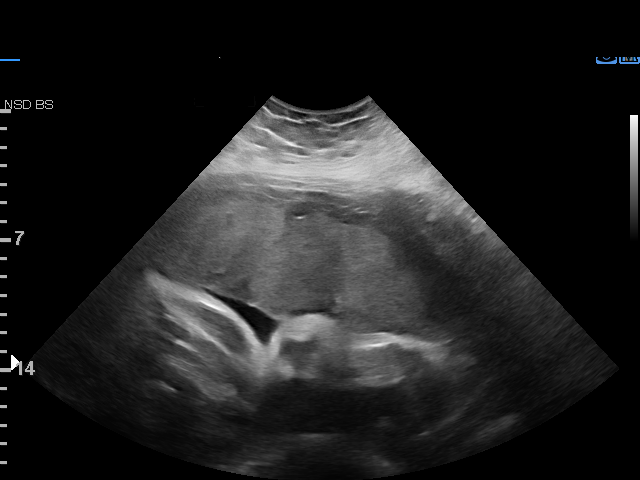

[8 of 8 positions shown; findings below may reference images not displayed]

Indications

 Marginal insertion of umbilical cord affecting
 management of mother in third trimester
 35 weeks gestation of pregnancy
 Obesity complicating pregnancy, second
 trimester (pregravid BMI 47)
 Encounter for uncertain dates
 Low risk NIPS, neg AFP
 Genetic carrier (silent Samina Millette)
Fetal Evaluation

 Num Of Fetuses:         1
 Fetal Heart Rate(bpm):  150
 Cardiac Activity:       Observed
 Presentation:           Cephalic
 Placenta:               Anterior
 P. Cord Insertion:      Marginal insertion

 Amniotic Fluid
 AFI FV:      Within normal limits

 AFI Sum(cm)     %Tile
 13.5            47

 RUQ(cm)       RLQ(cm)       LUQ(cm)        LLQ(cm)

Biophysical Evaluation

 Amniotic F.V:   Within normal limits       F. Tone:        Observed
 F. Movement:    Observed                   Score:          [DATE]
 F. Breathing:   Observed
OB History

 Gravidity:    2         Term:   0        Prem:   0        SAB:   1
 TOP:          0       Ectopic:  0        Living: 0
Gestational Age

 LMP:           34w 3d        Date:  06/08/20                 EDD:   03/15/21
 Best:          35w 4d     Det. By:  U/S  (10/20/20)          EDD:   03/07/21
Impression

 Prepregnancy BMI 47.
 Amniotic fluid is normal and good fetal activity is seen
 .Antenatal testing is reassuring. BPP [DATE]. Cephalic
 presentation.
Recommendations

 -Continue weekly BPP till delivery.
                 Casseus, Lauris

## 2022-07-30 IMAGING — US US FETAL BPP W/ NON-STRESS
1 series · 12 of 12 positions shown · non-contrast
Comparison: none

[Series 1: us fetal bpp w/ non-stress · 12 acquisitions, 12 frames shown]
[im 1/12]
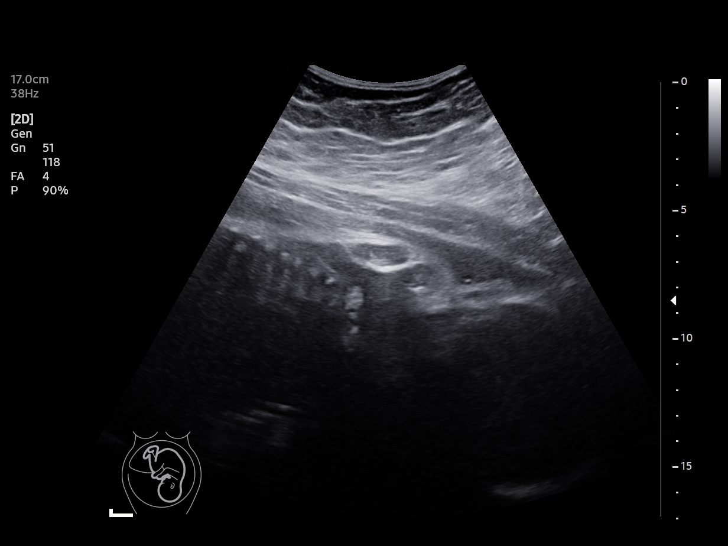
[im 2/12]
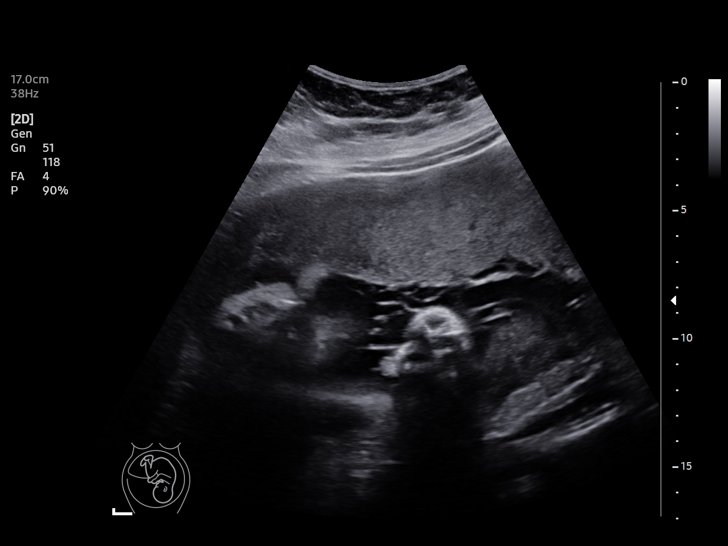
[im 3/12]
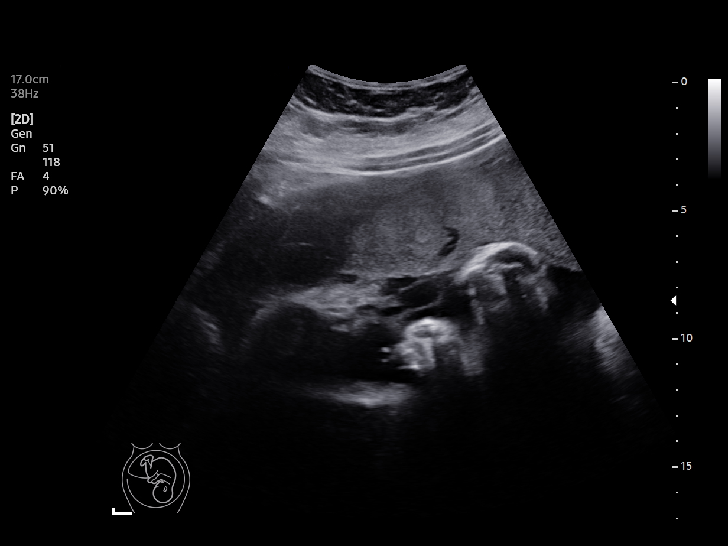
[im 4/12]
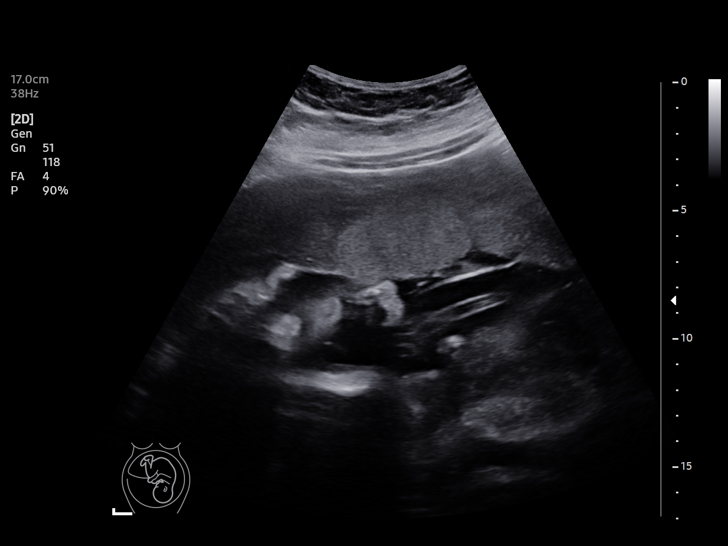
[im 5/12]
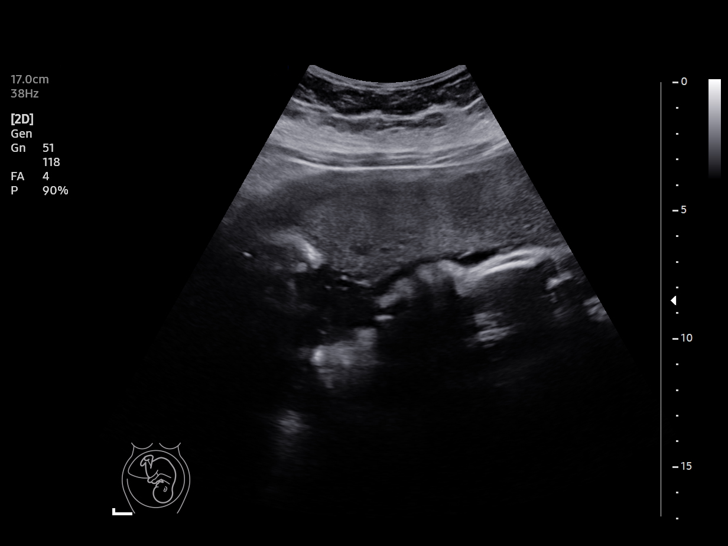
[im 6/12]
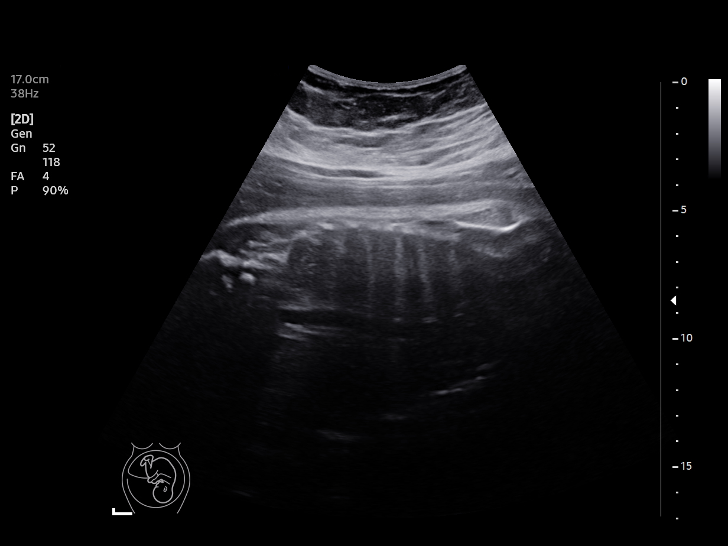
[im 7/12]
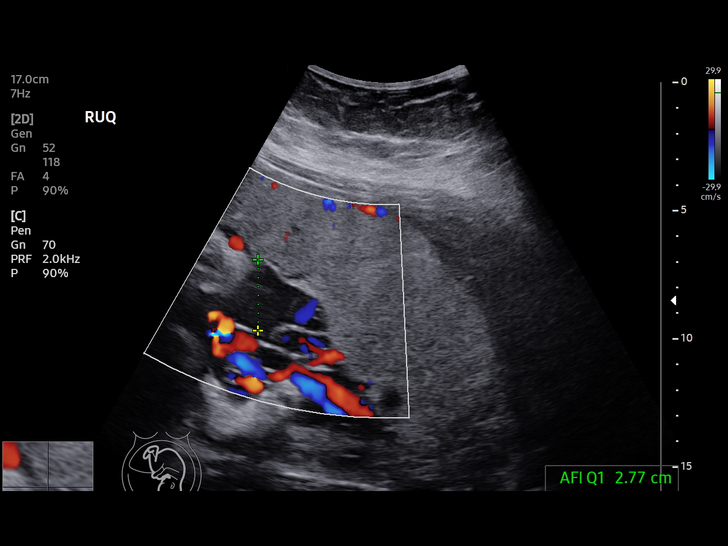
[im 8/12]
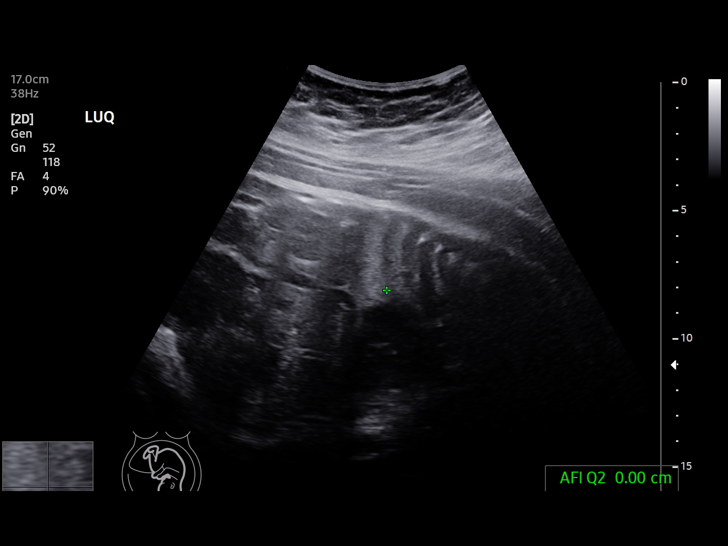
[im 9/12]
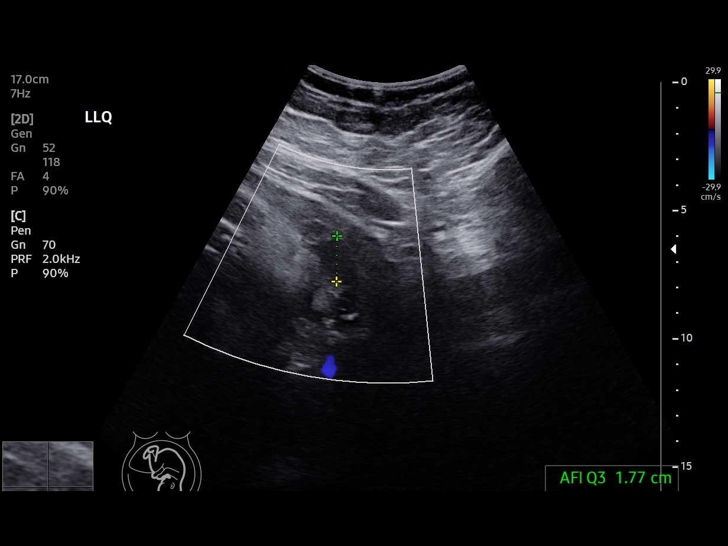
[im 10/12]
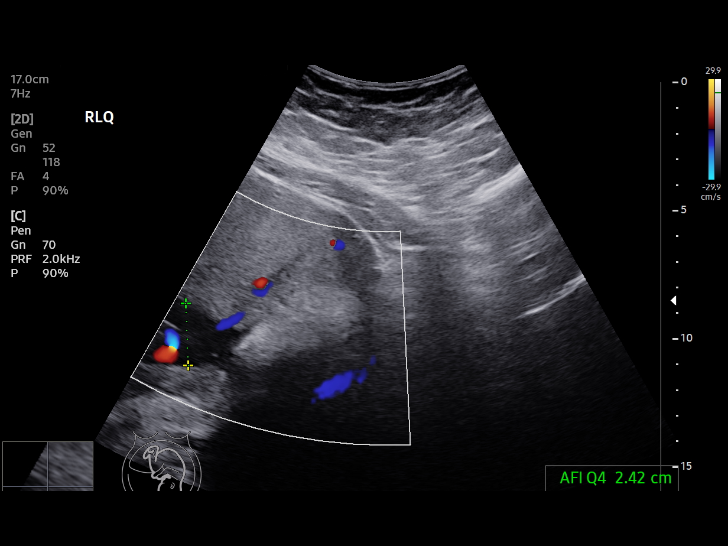
[im 11/12]
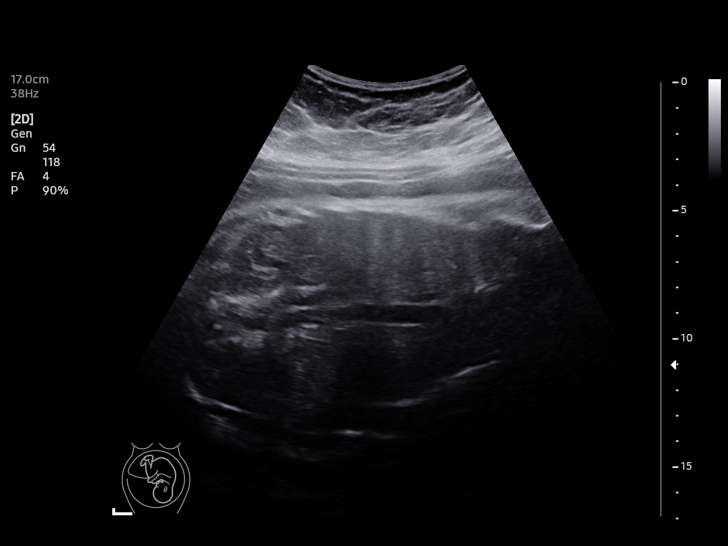
[im 12/12]
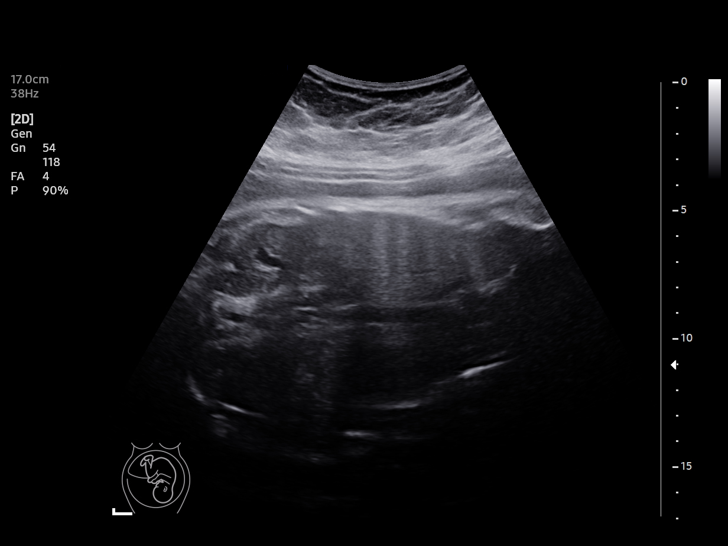

[12 of 12 positions shown; findings below may reference images not displayed]

Healthcare at

Service(s) Provided

Indications

 37 weeks gestation of pregnancy
 Obesity complicating pregnancy, third trimester
Fetal Evaluation

 Num Of Fetuses:          1
 Preg. Location:          Intrauterine
 Cardiac Activity:        Observed
 Presentation:            Cephalic

 Amniotic Fluid
 AFI FV:      Subjectively low-normal

 AFI Sum(cm)     %Tile       Largest Pocket(cm)


 RUQ(cm)       RLQ(cm)        LUQ(cm)        LLQ(cm)
 2.77          2.42           0

 Comment:     Breathing noted intermittently, but not sustained.  BPP [REDACTED]for
              breathing
Biophysical Evaluation

 Amniotic F.V:   Pocket => 2 cm              F. Tone:         Observed
 F. Movement:    Observed                    N.S.T:           Reactive
 F. Breathing:   Not Observed                Score:           [DATE]
OB History

 Gravidity:     2         Term:  0          Prem:  0        SAB:   1
 TOP:           0       Ectopic: 0         Living: 0
Gestational Age

 LMP:            36w 1d       Date:  [DATE]                   EDD:  06/08/20
 Best:           37w 2d    Det. By:  U/S  (03/15/21)            EDD:  10/20/20
